# Patient Record
Sex: Female | Born: 1935 | Race: White | Hispanic: No | Smoking: Never smoker
Health system: Southern US, Community
[De-identification: ages and names within clinical notes are randomized; demographics above are authoritative.]

## PROBLEM LIST (undated history)

## (undated) DIAGNOSIS — C55 Malignant neoplasm of uterus, part unspecified: Secondary | ICD-10-CM

## (undated) DIAGNOSIS — K219 Gastro-esophageal reflux disease without esophagitis: Secondary | ICD-10-CM

## (undated) DIAGNOSIS — G8220 Paraplegia, unspecified: Secondary | ICD-10-CM

## (undated) DIAGNOSIS — R569 Unspecified convulsions: Secondary | ICD-10-CM

## (undated) DIAGNOSIS — G20A1 Parkinson's disease without dyskinesia, without mention of fluctuations: Secondary | ICD-10-CM

## (undated) DIAGNOSIS — I10 Essential (primary) hypertension: Secondary | ICD-10-CM

## (undated) DIAGNOSIS — G20 Parkinson's disease: Secondary | ICD-10-CM

## (undated) DIAGNOSIS — M1990 Unspecified osteoarthritis, unspecified site: Secondary | ICD-10-CM

## (undated) DIAGNOSIS — G9589 Other specified diseases of spinal cord: Secondary | ICD-10-CM

## (undated) HISTORY — PX: KNEE SURGERY: SHX244

## (undated) HISTORY — DX: Unspecified convulsions: R56.9

## (undated) HISTORY — PX: CHOLECYSTECTOMY: SHX55

## (undated) HISTORY — DX: Parkinson's disease without dyskinesia, without mention of fluctuations: G20.A1

## (undated) HISTORY — DX: Unspecified osteoarthritis, unspecified site: M19.90

## (undated) HISTORY — DX: Parkinson's disease: G20

## (undated) HISTORY — DX: Essential (primary) hypertension: I10

## (undated) HISTORY — PX: TONSILLECTOMY: SUR1361

## (undated) HISTORY — DX: Other specified diseases of spinal cord: G95.89

## (undated) HISTORY — DX: Malignant neoplasm of uterus, part unspecified: C55

## (undated) HISTORY — DX: Gastro-esophageal reflux disease without esophagitis: K21.9

## (undated) HISTORY — PX: ORIF HIP FRACTURE: SHX2125

---

## 2011-03-27 ENCOUNTER — Ambulatory Visit: Payer: Self-pay | Admitting: Oncology

## 2011-03-28 ENCOUNTER — Ambulatory Visit: Payer: Self-pay | Admitting: Oncology

## 2011-04-19 ENCOUNTER — Ambulatory Visit: Payer: Self-pay | Admitting: Oncology

## 2011-05-30 ENCOUNTER — Inpatient Hospital Stay: Payer: Self-pay | Admitting: Specialist

## 2011-09-25 LAB — CBC
HGB: 11.7 g/dL — ABNORMAL LOW (ref 12.0–16.0)
MCHC: 32.8 g/dL (ref 32.0–36.0)
WBC: 16.8 10*3/uL — ABNORMAL HIGH (ref 3.6–11.0)

## 2011-09-25 LAB — BASIC METABOLIC PANEL
EGFR (African American): >60
EGFR (Non-African Amer.): >60
Osmolality: 289 (ref 275–301)
Potassium: 4 mmol/L (ref 3.5–5.1)
Sodium: 142 mmol/L (ref 136–145)

## 2011-09-25 LAB — TROPONIN I: Troponin-I: <0.02 ng/mL

## 2011-09-26 LAB — CBC WITH DIFFERENTIAL/PLATELET
Basophil #: 0 x10 3/mm 3
Basophil %: 0.1 %
Eosinophil #: 0.2 x10 3/mm 3
Eosinophil %: 1.3 %
HCT: 32.8 % — ABNORMAL LOW
HGB: 10.8 g/dL — ABNORMAL LOW
Lymphocyte %: 76.8 %
Lymphs Abs: 9.3 x10 3/mm 3 — ABNORMAL HIGH
MCH: 29.9 pg
MCHC: 33 g/dL
MCV: 91 fL
Monocyte #: 0.5 "x10 3/mm "
Monocyte %: 4.1 %
Neutrophil #: 2.1 x10 3/mm 3
Neutrophil %: 17.7 %
Platelet: 207 x10 3/mm 3
RBC: 3.61 X10 6/mm 3 — ABNORMAL LOW
RDW: 16.5 % — ABNORMAL HIGH
WBC: 12.1 x10 3/mm 3 — ABNORMAL HIGH

## 2011-09-26 LAB — BASIC METABOLIC PANEL WITH GFR
Anion Gap: 7
BUN: 19 mg/dL — ABNORMAL HIGH
Calcium, Total: 7.9 mg/dL — ABNORMAL LOW
Chloride: 109 mmol/L — ABNORMAL HIGH
Co2: 28 mmol/L
Creatinine: 0.45 mg/dL — ABNORMAL LOW
EGFR (African American): >60
EGFR (Non-African Amer.): >60
Glucose: 78 mg/dL
Osmolality: 288
Potassium: 3.6 mmol/L
Sodium: 144 mmol/L

## 2011-09-26 LAB — LIPID PANEL
Cholesterol: 131 mg/dL (ref 0–200)
HDL Cholesterol: 49 mg/dL (ref 40–60)
Ldl Cholesterol, Calc: 67 mg/dL (ref 0–100)
Triglycerides: 74 mg/dL (ref 0–200)
VLDL Cholesterol, Calc: 15 mg/dL (ref 5–40)

## 2011-09-26 LAB — MAGNESIUM: Magnesium: 1.9 mg/dL

## 2011-09-26 LAB — CK TOTAL AND CKMB (NOT AT ARMC)
CK, Total: 35 U/L (ref 21–215)
CK, Total: 33 U/L (ref 21–215)
CK-MB: 0.5 ng/mL (ref 0.5–3.6)

## 2011-09-26 LAB — TROPONIN I
Troponin-I: <0.02 ng/mL
Troponin-I: <0.02 ng/mL

## 2011-09-27 ENCOUNTER — Inpatient Hospital Stay: Payer: Self-pay | Admitting: Internal Medicine

## 2012-04-16 ENCOUNTER — Ambulatory Visit: Payer: Self-pay | Admitting: Oncology

## 2012-04-18 ENCOUNTER — Ambulatory Visit: Payer: Self-pay | Admitting: Oncology

## 2012-04-23 ENCOUNTER — Ambulatory Visit: Payer: Self-pay | Admitting: Oncology

## 2012-04-23 LAB — COMPREHENSIVE METABOLIC PANEL
Alkaline Phosphatase: 102 U/L (ref 50–136)
Calcium, Total: 8.8 mg/dL (ref 8.5–10.1)
Chloride: 97 mmol/L — ABNORMAL LOW (ref 98–107)
Co2: 36 mmol/L — ABNORMAL HIGH (ref 21–32)
Creatinine: 0.71 mg/dL (ref 0.60–1.30)
EGFR (African American): >60
EGFR (Non-African Amer.): >60
Glucose: 85 mg/dL (ref 65–99)
SGOT(AST): 20 U/L (ref 15–37)
SGPT (ALT): 18 U/L (ref 12–78)
Sodium: 136 mmol/L (ref 136–145)
Total Protein: 6.2 g/dL — ABNORMAL LOW (ref 6.4–8.2)

## 2012-04-23 LAB — CBC CANCER CENTER
Basophil #: 0.2 x10 3/mm — ABNORMAL HIGH (ref 0.0–0.1)
Eosinophil #: 0.1 x10 3/mm (ref 0.0–0.7)
HCT: 41.6 % (ref 35.0–47.0)
Lymphocyte #: 25.4 x10 3/mm — ABNORMAL HIGH (ref 1.0–3.6)
Lymphocyte %: 86.4 %
MCHC: 32.5 g/dL (ref 32.0–36.0)
Monocyte %: 0.5 %
Neutrophil %: 12.1 %
RBC: 4.41 10*6/uL (ref 3.80–5.20)
RDW: 14.3 % (ref 11.5–14.5)
WBC: 29.4 x10 3/mm — ABNORMAL HIGH (ref 3.6–11.0)

## 2012-05-18 ENCOUNTER — Ambulatory Visit: Payer: Self-pay | Admitting: Oncology

## 2012-07-21 ENCOUNTER — Ambulatory Visit: Payer: Self-pay | Admitting: Oncology

## 2012-07-22 LAB — COMPREHENSIVE METABOLIC PANEL
Albumin: 3.4 g/dL (ref 3.4–5.0)
Alkaline Phosphatase: 114 U/L (ref 50–136)
Anion Gap: 5 — ABNORMAL LOW (ref 7–16)
Bilirubin,Total: 0.3 mg/dL (ref 0.2–1.0)
Co2: 32 mmol/L (ref 21–32)
Creatinine: 0.64 mg/dL (ref 0.60–1.30)
EGFR (African American): >60
EGFR (Non-African Amer.): >60
Glucose: 90 mg/dL (ref 65–99)
Potassium: 4.3 mmol/L (ref 3.5–5.1)
SGOT(AST): 25 U/L (ref 15–37)
Sodium: 134 mmol/L — ABNORMAL LOW (ref 136–145)
Total Protein: 6.5 g/dL (ref 6.4–8.2)

## 2012-08-16 ENCOUNTER — Ambulatory Visit: Payer: Self-pay | Admitting: Oncology

## 2012-09-16 ENCOUNTER — Ambulatory Visit: Payer: Self-pay | Admitting: Oncology

## 2012-09-24 LAB — CBC CANCER CENTER
Basophil #: 0.3 x10 3/mm — ABNORMAL HIGH (ref 0.0–0.1)
Basophil %: 0.8 %
Eosinophil %: 0.3 %
HCT: 42.6 % (ref 35.0–47.0)
HGB: 13.7 g/dL (ref 12.0–16.0)
Lymphocyte #: 29.8 x10 3/mm — ABNORMAL HIGH (ref 1.0–3.6)
Lymphocyte %: 87 %
Monocyte #: 0.3 x10 3/mm (ref 0.2–0.9)
Monocyte %: 0.9 %
Neutrophil #: 3.7 x10 3/mm (ref 1.4–6.5)
Platelet: 268 x10 3/mm (ref 150–440)
RBC: 4.5 10*6/uL (ref 3.80–5.20)
RDW: 14 % (ref 11.5–14.5)

## 2012-10-16 ENCOUNTER — Ambulatory Visit: Payer: Self-pay | Admitting: Oncology

## 2013-04-01 ENCOUNTER — Ambulatory Visit: Payer: Self-pay | Admitting: Oncology

## 2013-04-01 LAB — CBC CANCER CENTER
HGB: 13.8 g/dL (ref 12.0–16.0)
Lymphocyte #: 30.9 x10 3/mm — ABNORMAL HIGH (ref 1.0–3.6)
Lymphocyte %: 89.2 %
MCHC: 31.9 g/dL — ABNORMAL LOW (ref 32.0–36.0)
Monocyte #: 0.1 x10 3/mm — ABNORMAL LOW (ref 0.2–0.9)
Monocyte %: 0.3 %
Neutrophil #: 3.3 x10 3/mm (ref 1.4–6.5)
Platelet: 226 x10 3/mm (ref 150–440)
RBC: 4.43 10*6/uL (ref 3.80–5.20)
RDW: 14.5 % (ref 11.5–14.5)
WBC: 34.7 x10 3/mm — ABNORMAL HIGH (ref 3.6–11.0)

## 2013-04-18 ENCOUNTER — Ambulatory Visit: Payer: Self-pay | Admitting: Oncology

## 2013-10-14 ENCOUNTER — Ambulatory Visit: Payer: Self-pay | Admitting: Oncology

## 2013-10-14 LAB — CBC CANCER CENTER
Basophil #: 0.1 x10 3/mm (ref 0.0–0.1)
Basophil %: 0.3 %
Eosinophil #: 0.2 x10 3/mm (ref 0.0–0.7)
Eosinophil %: 0.7 %
HCT: 41.6 % (ref 35.0–47.0)
HGB: 13.5 g/dL (ref 12.0–16.0)
LYMPHS PCT: 87.3 %
Lymphocyte #: 30.1 x10 3/mm — ABNORMAL HIGH (ref 1.0–3.6)
MCH: 30.8 pg (ref 26.0–34.0)
MCHC: 32.5 g/dL (ref 32.0–36.0)
MCV: 95 fL (ref 80–100)
MONO ABS: 0.1 x10 3/mm — AB (ref 0.2–0.9)
Monocyte %: 0.4 %
NEUTROS ABS: 3.9 x10 3/mm (ref 1.4–6.5)
NEUTROS PCT: 11.3 %
Platelet: 233 x10 3/mm (ref 150–440)
RBC: 4.39 10*6/uL (ref 3.80–5.20)
RDW: 14.2 % (ref 11.5–14.5)
WBC: 34.5 x10 3/mm — AB (ref 3.6–11.0)

## 2013-10-16 ENCOUNTER — Ambulatory Visit: Payer: Self-pay | Admitting: Oncology

## 2014-03-18 ENCOUNTER — Ambulatory Visit: Payer: Self-pay | Admitting: Oncology

## 2014-04-14 LAB — CBC CANCER CENTER
Basophil #: 0.2 x10 3/mm — ABNORMAL HIGH (ref 0.0–0.1)
Basophil %: 0.4 %
EOS PCT: 0.6 %
Eosinophil #: 0.3 x10 3/mm (ref 0.0–0.7)
HCT: 40.5 % (ref 35.0–47.0)
HGB: 13.2 g/dL (ref 12.0–16.0)
Lymphocyte #: 40.2 x10 3/mm — ABNORMAL HIGH (ref 1.0–3.6)
Lymphocyte %: 89.7 %
MCH: 31.7 pg (ref 26.0–34.0)
MCHC: 32.6 g/dL (ref 32.0–36.0)
MCV: 97 fL (ref 80–100)
MONO ABS: 0.2 x10 3/mm (ref 0.2–0.9)
Monocyte %: 0.6 %
NEUTROS PCT: 8.7 %
Neutrophil #: 3.9 x10 3/mm (ref 1.4–6.5)
Platelet: 208 x10 3/mm (ref 150–440)
RBC: 4.16 10*6/uL (ref 3.80–5.20)
RDW: 14.1 % (ref 11.5–14.5)
WBC: 44.7 x10 3/mm — ABNORMAL HIGH (ref 3.6–11.0)

## 2014-04-18 ENCOUNTER — Ambulatory Visit: Payer: Self-pay | Admitting: Oncology

## 2014-10-10 NOTE — Consult Note (Signed)
PATIENT NAME:  Rachael Pham, Rachael Pham MR#:  [id number] DATE OF BIRTH:  March 18, 1927  DATE OF CONSULTATION:  09/26/2011  REFERRING PHYSICIAN:   CONSULTING PHYSICIAN:  Corey Skains, MD  PRIMARY CARE PHYSICIAN: Dr. Bridget Hartshorn   REASON FOR CONSULTATION: Unstable angina.   CHIEF COMPLAINT: I had nausea and chest pain.  HISTORY OF PRESENT ILLNESS: This is an 81 year old female with hypertension previously under reasonable control. There is no evidence of previous hyperlipidemia requiring further intervention. The patient has been having a hip fracture for which she is rehabilitating and with rehabilitation she has done relatively well but has had significant episodes of shortness of breath with abdominal discomfort, nausea, and vomiting. There was some minimal chest pain with that. The patient does have a normal EKG and normal troponin. She underwent a Lexiscan infusion showing a small fixed and reversible inferior myocardial perfusion defect consistent with previous infarct and/or possible myocardial ischemia. The patient has had no further episodes of chest pain although she is not ambulatory.   Remainder of review of systems is negative for vision change, ringing in the ears, hearing loss, cough, congestion, heartburn, nausea, vomiting, diarrhea, bloody stools, stomach pain, extremity pain, leg weakness, cramping of the buttocks, known blood clots, headaches, blackouts, dizzy spells, nosebleeds, congestion, trouble swallowing, frequent urination, urination at night, muscle weakness, numbness, anxiety, depression, skin lesions, skin rashes.   PAST MEDICAL HISTORY: Hypertension.   FAMILY HISTORY: No family members with early onset of cardiovascular disease.   SOCIAL HISTORY: The patient has remote tobacco use. Currently denies alcohol or tobacco use.   ALLERGIES: No known drug allergies.   CURRENT MEDICATIONS: As listed.   PHYSICAL EXAMINATION:   VITAL SIGNS: Blood pressure 136/68 bilaterally, heart  rate 72 upright, reclining, and regular.   GENERAL: She is a well appearing female in no acute distress.   HEENT: No icterus, thyromegaly, ulcers, hemorrhage, or xanthelasma.   CARDIOVASCULAR: Regular rate and rhythm. Normal S1 and S2 without murmur, gallop, or rub. PMI is normal size and placement. Carotid upstroke normal without bruit. Jugular venous pressure normal.   LUNGS: Lungs have few basilar crackles with normal respirations.   ABDOMEN: Soft, nontender without hepatosplenomegaly or masses. Abdominal aorta is normal size without bruit.   EXTREMITIES: 2+ bilateral pulses in dorsal, pedal, radial, and femoral arteries without lower extremity edema, cyanosis, clubbing, or ulcers.   NEUROLOGIC: She is oriented to time, place, and person with normal mood and affect.   ASSESSMENT: This is an 81 year old female with hypertension, nausea, vomiting, chest pain, shortness of breath, normal EKG, no evidence of myocardial infarction with abnormal stress test consistent with inferior myocardial ischemia.   RECOMMENDATIONS: Would recommend cardiac catheterization to assess coronary anatomy and further treatment thereof as necessary. The patient understands the risks and benefits of cardiac catheterization. These include the possibility of death, stroke, heart attack, infection, bleeding, or blood clot. She is at low risk for conscious sedation. The patient does not wish to pursue this at this time and would use medication management for hypertension control including aspirin for further risk reduction. We would recommend ambulation to follow for any further evidence of significant chest pain requiring further intervention including cardiac catheterization.  ____________________________ Corey Skains, MD bjk:drc D: 09/26/2011 17:50:03 ET T: 09/27/2011 10:09:46 ET JOB#: [id number] Corey Skains MD ELECTRONICALLY SIGNED 09/27/2011 13:18

## 2014-10-10 NOTE — H&P (Signed)
PATIENT NAME:  Rachael Pham, Rachael Pham MR#:  [id number] DATE OF BIRTH:  1927/05/26  DATE OF ADMISSION:  09/25/2011  REFERRING PHYSICIAN: ER physician, Dr. Rockey Situ PRIMARY CARE PHYSICIAN: Dr. Bridget Hartshorn   CHIEF COMPLAINT: Chest pain.  HISTORY OF PRESENT ILLNESS: Patient is an 81 year old Caucasian female with past medical history of hypertension, chronic leukocytosis who was last admitted to [facility] 12/12 for her left hip fracture status post open reduction internal fixation. She presents today to the ED with complaints of chest pain. Patient reports that she woke up with chest pain yesterday and subsequently had several episodes of nausea and vomiting. Patient thought that it was possibly related to indigestion, however, she got concerned and came to the ED today because she woke up with retrosternal pain described as somebody pressing on her chest, today also. She denies any nausea, vomiting today. Denies any shortness of breath, diaphoresis or radiation. She is not very ambulatory and usually walks with a walker. She denies any prior history of any coronary artery disease or heart problems.   PAST MEDICAL HISTORY:  1. Uterine cancer status post radiation and hysterectomy.  2. Hypertension.  3. 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  4. 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 Patient was seen by Dr. Grayland Ormond in October 2012.  5. Questionable history of seizures. Patient denies any history of seizure and is not on any anticonvulsant medications.  6. Last admission to [facility] 12/12 for left hip fracture status post open reduction internal fixation.   PAST SURGICAL HISTORY:  1. Left hip open reduction internal fixation. 2. Tonsillectomy.  3. Cholecystectomy. 4. Knee surgery.  5. C-section. 6. Hysterectomy.   ALLERGIES: Penicillin and sulfa.   MEDICATIONS:  1. Atenolol 50 mg daily.  2. Calcium with vitamin D 1 tablet daily.  3. Fosamax 70 mg q. weekly.  4. Neurontin 600 mg at bedtime. 5. Metamucil  p.r.n.  6. MiraLax p.r.n.  7. Senna p.r.n.  8. Norco 5/325, 1 tablet every six hours p.r.n.  9. Tylenol p.r.n.   FAMILY HISTORY: Positive for coronary artery disease and cancer.   SOCIAL HISTORY: Patient lives at [facility] in [place]. Prior to that she was living with her son. Denies any alcohol or drug abuse. She quit smoking more than 30-40 years ago, used to smoke half pack per Janelle.    REVIEW OF SYSTEMS: CONSTITUTIONAL: Denies any fever, fatigue, weakness. EYES: Denies any blurred or double vision. ENT: Denies tinnitus, ear pain. RESPIRATORY: Denies any cough, wheezing. CARDIOVASCULAR: Denies any palpitations or syncope. Reports chest pain. GASTROINTESTINAL: Denies any nausea, vomiting, diarrhea, abdominal pain today. GENITOURINARY: Denies any dysuria, hematuria. ENDO: Denies any polyuria, nocturia. HEME/LYMPH: Denies any anemia, easy bruisability. INTEGUMENT: Denies any acne, rash. MUSCULOSKELETAL: Denies any swelling, gout. NEUROLOGICAL: Denies any numbness, weakness. PSYCH: Denies any anxiety, depression.   PHYSICAL EXAMINATION:  VITAL SIGNS: Temperature 96.6, heart rate 71, respiratory rate 18, blood pressure 126/59, pulse ox 96% on room air.   GENERAL: Patient is an elderly Caucasian female lying comfortably in bed, not in acute distress.   HEAD: Atraumatic, normocephalic.   EYES: There is some pallor. No icterus or cyanosis. Pupils are equal, round, and reactive to light and accommodation. Extraocular movements intact.   ENT: Wet mucous membranes. No oropharyngeal erythema or thrush.   NECK: Supple. No masses. No JVD. No thyromegaly.   CHEST WALL: No tenderness to palpation. Not using accessory muscles of respiration. No intercostal muscle retractions.   LUNGS: Bilaterally clear to auscultation. No wheezing, rales, rhonchi.  CARDIOVASCULAR: S1, S2 regular. No murmur, rubs, or gallops.   ABDOMEN: Soft, nontender, nondistended. No guarding. No rigidity. No  organomegaly. Normal bowel sounds.   SKIN: No rashes or lesions.   PERIPHERIES: Patient has 1+ pedal edema and 1+ pedal pulses.   MUSCULOSKELETAL: No cyanosis or clubbing.   NEUROLOGIC: Awake, alert, oriented x3. Nonfocal neurological exam. Cranial nerves grossly intact.   PSYCH: Normal mood and affect.   LABORATORY, DIAGNOSTIC, AND RADIOLOGICAL DATA: Chest x-ray shows no acute abnormality. White count 16.8, hemoglobin 11.7, hematocrit 35.6, platelets 226, glucose 87, BUN 30, creatinine 0.52, sodium 142, rest of BMP is normal. Cardiac enzymes negative. EKG shows no evidence of any ischemia.   ASSESSMENT AND PLAN: 81 year old female with past medical history of hypertension presents and recurrent chest pain. 1. Chest pain. Will admit the patient to the hospital and monitor on telemetry. Check serial cardiac enzymes. Start on aspirin. Continue her beta blocker. Start on ACE inhibitor, statin, p.r.n. nitro paste. Will check a fasting lipid profile. Will obtain an inpatient stress test. 2. Leukocytosis. Appears to be chronic. Patient has been evaluated for CLL by Dr. Grayland Ormond in the past.  3. Anemia, possibly of chronic disease. Will monitor closely.  4. Mild dehydration, elevated BUN. Will give patient gentle IV fluid hydration and recheck in a.m.  5. Hypertension. Appears to be stable at present. Will continue current medications.   Reviewed all medical records, discussed with ED physician, discussed with the patient and her son the plan of care and management.   TIME SPENT: 75 minutes.   ____________________________ Cherre Huger, MD sp:cms D: 09/25/2011 18:06:41 ET T: 09/26/2011 05:49:19 ET JOB#: [id number]  cc: Cherre Huger, MD, <Dictator> Manual Meier. Bridget Hartshorn, MD Cherre Huger MD ELECTRONICALLY SIGNED 09/28/2011 17:49

## 2014-10-10 NOTE — Discharge Summary (Signed)
PATIENT NAME:  Rachael Pham, Rachael Pham MR#:  [id number] DATE OF BIRTH:  06/01/1927  DATE OF ADMISSION:  09/27/2011 DATE OF DISCHARGE:  09/28/2011   DISCHARGE DIAGNOSES:  1. Chest pain with negative serial cardiac enzymes, abnormal Myoview requiring cardiac cath which was negative and showed normal LV function, normal coronary arteries, possible etiology could be stomach indigestion, now improved.  2. Dehydration, resolved with IV fluids, possibly due to recent nausea and vomiting.   SECONDARY DIAGNOSES:  1. Hypertension.  2. 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  3. Leukocytosis. 4. History of seizure.   CONSULTATION: Cardiology, Dr. Nehemiah Massed   PROCEDURES/RADIOLOGY:  1. Cardiac cath on April 11th showed normal coronary arteries, normal LV function.  2. Myoview on April 10th showed mild fixed and reversible inferior myocardial perfusion defect consistent with previous infarct scar and/or minimal reversibility consistent with myocardial ischemia. Ejection fraction of 36%. Mild segmental LV systolic dysfunction.  3. Chest x-ray on April 9th showed no acute cardiopulmonary disease.   HISTORY AND SHORT HOSPITAL COURSE: The patient is an 81 year old female with the above-mentioned medical problems who was admitted for chest pain, was ruled out with three negative sets of cardiac enzymes but had abnormal Myoview with some reversible ischemia for which Cardiology consult was obtained with Dr. Nehemiah Massed who recommended cardiac cath which was obtained and was negative. She is feeling much better. Her chest pain was thought to be due to stomach indigestion as she had some nausea and vomiting and has been resolved now. Her dehydration was resolved with IV fluids. She is doing much better and is being discharged back to her facility in stable condition.   On the date of discharge her vital signs are as follows: Temperature 98.2, heart rate 55 per minute, respirations 18 per minute, blood pressure  101/54 mmHg. She is saturating 96% on room air.   PERTINENT PHYSICAL EXAMINATION: CARDIOVASCULAR: S1, S2 normal. No murmurs, rubs, or gallops. LUNGS: Clear to auscultation bilaterally. No wheezes, rales, rhonchi, or crepitation. ABDOMEN: Soft, benign. NEUROLOGICAL: Nonfocal examination. All other physical examination remained at baseline.   DISCHARGE MEDICATIONS:  1. Norco 5/325 1 tablet p.o. every six hours as needed.  2. MiraLAX once daily as needed.  3. Metamucil 5 mL p.o. daily as needed.  4. Senna 8.6 mg 2 tablets p.o. daily as needed.  5. Fosamax 70 mg once a week.  6. Gabapentin 600 mg p.o. at bedtime.  7. Calcium with Vitamin D 1 tablet p.o. daily.  8. Tylenol 650 mg p.o. every four hours as needed.  9. Atenolol 25 mg p.o. daily.  DISCHARGE DIET: Low sodium.    DISCHARGE ACTIVITY: As tolerated.   DISCHARGE INSTRUCTIONS AND FOLLOW-UP:  1. The patient was instructed to follow up with her primary care physician, Dr. Marval Regal, in 1 to 2 weeks.  2. She will need follow-up with Dr. Nehemiah Massed in 2 to 3 weeks.   TOTAL TIME DISCHARGING THIS PATIENT: 45 minutes.   ____________________________ Lucina Mellow. Manuella Ghazi, MD vss:drc D: 09/28/2011 08:33:41 ET T: 09/28/2011 08:57:13 ET JOB#: [id number]  cc: Alim Cattell S. Manuella Ghazi, MD, <Dictator>, Manual Meier. Bridget Hartshorn, MD, Corey Skains, MD Lucina Mellow [facility] MD ELECTRONICALLY SIGNED 09/28/2011 16:38

## 2014-10-15 ENCOUNTER — Ambulatory Visit
Admit: 2014-10-15 | Disposition: A | Discharge status: Home / Self Care | Payer: Self-pay | Attending: Oncology | Admitting: Oncology

## 2014-10-15 LAB — CBC CANCER CENTER
BASOS ABS: 0.2 x10 3/mm — AB (ref 0.0–0.1)
Basophil %: 0.4 %
EOS ABS: 0.1 x10 3/mm (ref 0.0–0.7)
Eosinophil %: 0.3 %
HCT: 40 % (ref 35.0–47.0)
HGB: 13.1 g/dL (ref 12.0–16.0)
LYMPHS ABS: 45.8 x10 3/mm — AB (ref 1.0–3.6)
Lymphocyte %: 90.7 %
MCH: 31.6 pg (ref 26.0–34.0)
MCHC: 32.8 g/dL (ref 32.0–36.0)
MCV: 97 fL (ref 80–100)
MONO ABS: 0.2 x10 3/mm (ref 0.2–0.9)
MONOS PCT: 0.4 %
Neutrophil #: 4.1 x10 3/mm (ref 1.4–6.5)
Neutrophil %: 8.2 %
PLATELETS: 194 x10 3/mm (ref 150–440)
RBC: 4.15 10*6/uL (ref 3.80–5.20)
RDW: 13.8 % (ref 11.5–14.5)
WBC: 50.4 x10 3/mm — AB (ref 3.6–11.0)

## 2015-04-15 ENCOUNTER — Other Ambulatory Visit: Payer: Self-pay

## 2015-04-15 ENCOUNTER — Ambulatory Visit: Payer: Self-pay | Admitting: Oncology

## 2015-04-29 ENCOUNTER — Inpatient Hospital Stay (HOSPITAL_BASED_OUTPATIENT_CLINIC_OR_DEPARTMENT_OTHER): Payer: Medicare Other | Admitting: Oncology

## 2015-04-29 ENCOUNTER — Other Ambulatory Visit: Payer: Self-pay | Admitting: *Deleted

## 2015-04-29 ENCOUNTER — Inpatient Hospital Stay: Payer: Medicare Other | Attending: Oncology

## 2015-04-29 ENCOUNTER — Encounter: Payer: Self-pay | Admitting: Oncology

## 2015-04-29 VITALS — BP 129/56 | HR 76 | Temp 97.2°F | Resp 76

## 2015-04-29 DIAGNOSIS — G20 Parkinson's disease: Secondary | ICD-10-CM | POA: Insufficient documentation

## 2015-04-29 DIAGNOSIS — K219 Gastro-esophageal reflux disease without esophagitis: Secondary | ICD-10-CM | POA: Insufficient documentation

## 2015-04-29 DIAGNOSIS — Z79899 Other long term (current) drug therapy: Secondary | ICD-10-CM | POA: Insufficient documentation

## 2015-04-29 DIAGNOSIS — C9110 Chronic lymphocytic leukemia of B-cell type not having achieved remission: Secondary | ICD-10-CM

## 2015-04-29 DIAGNOSIS — I10 Essential (primary) hypertension: Secondary | ICD-10-CM | POA: Insufficient documentation

## 2015-04-29 LAB — CBC WITH DIFFERENTIAL/PLATELET
Basophils Absolute: 0.1 10*3/uL (ref 0–0.1)
Eosinophils Absolute: 0.1 10*3/uL (ref 0–0.7)
Eosinophils Relative: 0 %
HEMATOCRIT: 40.7 % (ref 35.0–47.0)
HEMOGLOBIN: 13.2 g/dL (ref 12.0–16.0)
Lymphocytes Relative: 93 %
Lymphs Abs: 48.6 10*3/uL — ABNORMAL HIGH (ref 1.0–3.6)
MCH: 31.7 pg (ref 26.0–34.0)
MCHC: 32.4 g/dL (ref 32.0–36.0)
MCV: 97.7 fL (ref 80.0–100.0)
Monocytes Absolute: 0.3 10*3/uL (ref 0.2–0.9)
NEUTROS ABS: 3.2 10*3/uL (ref 1.4–6.5)
Platelets: 208 10*3/uL (ref 150–440)
RBC: 4.16 MIL/uL (ref 3.80–5.20)
RDW: 14.5 % (ref 11.5–14.5)
WBC: 52.3 10*3/uL (ref 3.6–11.0)

## 2015-04-29 NOTE — Progress Notes (Signed)
Patient offers no concerns or problems today.

## 2015-04-29 NOTE — Progress Notes (Signed)
[place]  [phone] [phone]  ID: Otillia Javorsky Ceballos OB: [id number]  MR#: [id number]  [id number]  No care team member to display  CHIEF COMPLAINT:  Chief Complaint  Patient presents with  . CLL    INTERVAL HISTORY: Patient returns to clinic today for repeat laboratory work and further evaluation.  She continues to feel well and remains asymptomatic.  She denies any recent fevers or illnesses.  She has no night sweats.  She has a good appetite and denies weight loss.  She has no neurologic complaints.  She denies any pain.  She has no neurologic complaints.  She denies any chest pain, shortness of breath, or cough.  She has no nausea, vomiting, constipation, or diarrhea.  She has no urinary complaints.  Patient offers no specific complaints today.  REVIEW OF SYSTEMS:   Review of Systems  Constitutional: Negative for fever, weight loss and malaise/fatigue.  Respiratory: Negative.  Negative for shortness of breath.   Cardiovascular: Negative.   Gastrointestinal: Negative.   Musculoskeletal: Negative.   Neurological: Negative.  Negative for weakness.    As per HPI. Otherwise, a complete review of systems is negatve.  PAST MEDICAL HISTORY: Past Medical History  Diagnosis Date  . Hypertension   . Parkinson disease ([place])   . GERD (gastroesophageal reflux disease)   . Osteoarthritis   . 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 Unspecified convulsions ([place])     PAST SURGICAL HISTORY: Past Surgical History  Procedure Laterality Date  . Cholecystectomy      FAMILY HISTORY No family history on file.     ADVANCED DIRECTIVES:    HEALTH MAINTENANCE: Social History  Substance Use Topics  . Smoking status: Not on file  . Smokeless tobacco: Not on file  . Alcohol Use: Not on file     Colonoscopy:  PAP:  Bone density:  Lipid panel:  Allergies  Allergen Reactions  . Penicillins   . Sulfa Antibiotics     Current  Outpatient Prescriptions  Medication Sig Dispense Refill  . acetaminophen (TYLENOL) 325 MG tablet Take 650 mg by mouth every 4 (four) hours as needed.    Marland Kitchen alendronate (FOSAMAX) 70 MG tablet Take 70 mg by mouth once a week. Take with a full glass of water on an empty stomach.    . calcium-vitamin D (OSCAL WITH D) 500-200 MG-UNIT tablet Take 1 tablet by mouth.    . gabapentin (NEURONTIN) 300 MG capsule Take 300 mg by mouth 3 (three) times daily.    Marland Kitchen gabapentin (NEURONTIN) 600 MG tablet Take 600 mg by mouth at bedtime.    . magnesium hydroxide (MILK OF MAGNESIA) 400 MG/5ML suspension Take 30 mLs by mouth every other [place].    Marland Kitchen omeprazole (PRILOSEC) 20 MG capsule Take 20 mg by mouth daily.    Vladimir Faster Glycol-Propyl Glycol (SYSTANE) 0.4-0.3 % GEL ophthalmic gel Place 1 application into both eyes.    Marland Kitchen psyllium (METAMUCIL) 58.6 % powder Take 1 packet by mouth daily.    Marland Kitchen senna (SENOKOT) 8.6 MG tablet Take 2 tablets by mouth daily.     No current facility-administered medications for this visit.    OBJECTIVE: Filed Vitals:   04/29/15 0952  BP: 129/56  Pulse: 76  Temp: 97.2 F (36.2 C)  Resp: 76     There is no height or weight on file to calculate BMI.    ECOG FS:2 - Symptomatic, <50% confined to bed  General: Well-developed,  well-nourished, no acute distress. Eyes: Pink conjunctiva, anicteric sclera. Lungs: Clear to auscultation bilaterally. Heart: Regular rate and rhythm. No rubs, murmurs, or gallops. Abdomen: Soft, nontender, nondistended. No organomegaly noted, normoactive bowel sounds. Musculoskeletal: No edema, cyanosis, or clubbing. Neuro: Alert, answering all questions appropriately. Cranial nerves grossly intact. Skin: No rashes or petechiae noted. Psych: Normal affect.   LAB RESULTS:  Lab Results  Component Value Date   NA 134* 07/22/2012   K 4.3 07/22/2012   CL 97* 07/22/2012   CO2 32 07/22/2012   GLUCOSE 90 07/22/2012   BUN 16 07/22/2012   CREATININE 0.64  07/22/2012   CALCIUM 8.7 07/22/2012   PROT 6.5 07/22/2012   ALBUMIN 3.4 07/22/2012   AST 25 07/22/2012   ALT 19 07/22/2012   ALKPHOS 114 07/22/2012   BILITOT 0.3 07/22/2012   GFRNONAA >60 07/22/2012   GFRAA >60 07/22/2012    Lab Results  Component Value Date   WBC 52.3* 04/29/2015   NEUTROABS 3.2 04/29/2015   HGB 13.2 04/29/2015   HCT 40.7 04/29/2015   MCV 97.7 04/29/2015   PLT 208 04/29/2015     STUDIES: No results found.  ASSESSMENT:   PLAN:    1.  CLL: Flow cytometry confirmed the results.  Patient's white blood cell count continues to slowly trend up, but it has been 3 years and she has not had a doubling time.  Her white blood cell count in November 2013 was 29.4.  Patient is a Rai stage 0, therefore no intervention needed at this time.  Previously, CT scan of the neck, chest, abdomen, and pelvis did not reveal lymphadenopathy.  This does not need to be repeated unless there is suspicion of progression of disease. Return to clinic in 6 months with repeat laboratory work and further evaluation.    Patient expressed understanding and was in agreement with this plan. She also understands that She can call clinic at any time with any questions, concerns, or complaints.    Lloyd Huger, MD   04/29/2015 10:00 AM

## 2015-10-28 ENCOUNTER — Inpatient Hospital Stay: Payer: Medicare Other | Attending: Oncology

## 2015-10-28 ENCOUNTER — Inpatient Hospital Stay (HOSPITAL_BASED_OUTPATIENT_CLINIC_OR_DEPARTMENT_OTHER): Payer: Medicare Other | Admitting: Family Medicine

## 2015-10-28 VITALS — BP 111/41 | HR 80 | Temp 98.2°F | Resp 14

## 2015-10-28 DIAGNOSIS — Z8542 Personal history of malignant neoplasm of other parts of uterus: Secondary | ICD-10-CM | POA: Diagnosis not present

## 2015-10-28 DIAGNOSIS — K219 Gastro-esophageal reflux disease without esophagitis: Secondary | ICD-10-CM | POA: Insufficient documentation

## 2015-10-28 DIAGNOSIS — G20 Parkinson's disease: Secondary | ICD-10-CM | POA: Diagnosis not present

## 2015-10-28 DIAGNOSIS — Z79899 Other long term (current) drug therapy: Secondary | ICD-10-CM | POA: Diagnosis not present

## 2015-10-28 DIAGNOSIS — C9110 Chronic lymphocytic leukemia of B-cell type not having achieved remission: Secondary | ICD-10-CM

## 2015-10-28 DIAGNOSIS — M1990 Unspecified osteoarthritis, unspecified site: Secondary | ICD-10-CM | POA: Insufficient documentation

## 2015-10-28 DIAGNOSIS — I10 Essential (primary) hypertension: Secondary | ICD-10-CM | POA: Diagnosis not present

## 2015-10-28 LAB — CBC WITH DIFFERENTIAL/PLATELET
Basophils Absolute: 0.1 10*3/uL (ref 0–0.1)
Basophils Relative: 0 %
Eosinophils Absolute: 0.2 10*3/uL (ref 0–0.7)
Eosinophils Relative: 1 %
HEMATOCRIT: 40.6 % (ref 35.0–47.0)
HEMOGLOBIN: 13.2 g/dL (ref 12.0–16.0)
LYMPHS PCT: 89 %
Lymphs Abs: 32.3 10*3/uL — ABNORMAL HIGH (ref 1.0–3.6)
MCH: 30.4 pg (ref 26.0–34.0)
MCHC: 32.6 g/dL (ref 32.0–36.0)
MCV: 93.3 fL (ref 80.0–100.0)
MONO ABS: 0.2 10*3/uL (ref 0.2–0.9)
MONOS PCT: 1 %
NEUTROS ABS: 3.1 10*3/uL (ref 1.4–6.5)
NEUTROS PCT: 9 %
Platelets: 201 10*3/uL (ref 150–440)
RBC: 4.35 MIL/uL (ref 3.80–5.20)
RDW: 14.2 % (ref 11.5–14.5)
WBC: 36 10*3/uL — ABNORMAL HIGH (ref 3.6–11.0)

## 2015-10-28 NOTE — Progress Notes (Signed)
[place]  [phone] [phone]  ID: Rachael Pham OB: [id number]  MR#: [id number]  [id number]  No care team member to display  CHIEF COMPLAINT:  Chief Complaint  Patient presents with  . CLL    INTERVAL HISTORY: Patient returns to clinic today for repeat laboratory work and further evaluation.  She continues to feel well and remains asymptomatic.  She denies any recent fevers or illnesses.  She has no night sweats.  She has a good appetite and denies weight loss.  She has no neurologic complaints.  She denies any pain.  She has no neurologic complaints.  She denies any chest pain, shortness of breath, or cough.  She has no nausea, vomiting, constipation, or diarrhea.  She has no urinary complaints. Patient has been going to dermatology regarding areas on her face. Patient offers no specific complaints today.  REVIEW OF SYSTEMS:   Review of Systems  Constitutional: Positive for malaise/fatigue. Negative for fever, chills, weight loss and diaphoresis.       Lives in skilled nursing  HENT: Negative.   Eyes: Negative.   Respiratory: Negative.  Negative for cough, hemoptysis, sputum production, shortness of breath and wheezing.   Cardiovascular: Negative.  Negative for chest pain, palpitations, orthopnea, claudication, leg swelling and PND.  Gastrointestinal: Negative.  Negative for heartburn, nausea, vomiting, abdominal pain, diarrhea, constipation, blood in stool and melena.  Genitourinary: Negative.   Musculoskeletal: Negative.   Skin:       Dermatology following issues on right cheek and right nose  Neurological: Positive for weakness. Negative for dizziness, tingling, focal weakness and seizures.  Endo/Heme/Allergies: Does not bruise/bleed easily.  Psychiatric/Behavioral: Negative for depression. The patient is not nervous/anxious and does not have insomnia.     As per HPI. Otherwise, a complete review of systems is negatve.  PAST  MEDICAL HISTORY: Past Medical History  Diagnosis Date  . Hypertension   . Parkinson disease ([place])   . GERD (gastroesophageal reflux disease)   . Osteoarthritis   . 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 Uterine cancer ([place])     PAST SURGICAL HISTORY: Past Surgical History  Procedure Laterality Date  . Cholecystectomy    . Tonsillectomy    . Knee surgery    . Orif hip fracture      FAMILY HISTORY No family history on file.     ADVANCED DIRECTIVES:    HEALTH MAINTENANCE: Social History  Substance Use Topics  . Smoking status: Never Smoker   . Smokeless tobacco: Never Used  . Alcohol Use: No     Allergies  Allergen Reactions  . Penicillins   . Sulfa Antibiotics     Current Outpatient Prescriptions  Medication Sig Dispense Refill  . acetaminophen (TYLENOL) 325 MG tablet Take 650 mg by mouth every 4 (four) hours as needed.    Marland Kitchen alendronate (FOSAMAX) 70 MG tablet Take 70 mg by mouth once a week. Take with a full glass of water on an empty stomach.    . calcium-vitamin D (OSCAL WITH D) 500-200 MG-UNIT tablet Take 1 tablet by mouth.    . gabapentin (NEURONTIN) 600 MG tablet Take 600 mg by mouth at bedtime.    . magnesium hydroxide (MILK OF MAGNESIA) 400 MG/5ML suspension Take 30 mLs by mouth every other [place].    Marland Kitchen omeprazole (PRILOSEC) 20 MG capsule Take 20 mg by mouth daily.    Vladimir Faster Glycol-Propyl Glycol (SYSTANE) 0.4-0.3 %  GEL ophthalmic gel Place 1 application into both eyes.    Marland Kitchen psyllium (METAMUCIL) 58.6 % powder Take 1 packet by mouth daily.    Marland Kitchen senna (SENOKOT) 8.6 MG tablet Take 2 tablets by mouth daily.    Marland Kitchen gabapentin (NEURONTIN) 300 MG capsule Take 300 mg by mouth 3 (three) times daily. Reported on 10/28/2015     No current facility-administered medications for this visit.    OBJECTIVE: Filed Vitals:   10/28/15 0949  BP: 111/41  Pulse: 80  Temp: 98.2 F (36.8 C)  Resp: 14     There is no height or  weight on file to calculate BMI.    ECOG FS:2 - Symptomatic, <50% confined to bed  General: Well-developed, well-nourished, no acute distress. Eyes: Pink conjunctiva, anicteric sclera. Lungs: Clear to auscultation bilaterally. Heart: Regular rate and rhythm. No rubs, murmurs, or gallops. Abdomen: Soft, nontender, nondistended. No organomegaly noted, normoactive bowel sounds. Musculoskeletal: No edema, cyanosis, or clubbing. Neuro: Alert, answering all questions appropriately. Cranial nerves grossly intact. Skin: No rashes or petechiae noted. Psych: Normal affect. No cervical, axillary or supraclavicular lymphadenopathy.   LAB RESULTS:  Lab Results  Component Value Date   NA 134* 07/22/2012   K 4.3 07/22/2012   CL 97* 07/22/2012   CO2 32 07/22/2012   GLUCOSE 90 07/22/2012   BUN 16 07/22/2012   CREATININE 0.64 07/22/2012   CALCIUM 8.7 07/22/2012   PROT 6.5 07/22/2012   ALBUMIN 3.4 07/22/2012   AST 25 07/22/2012   ALT 19 07/22/2012   ALKPHOS 114 07/22/2012   BILITOT 0.3 07/22/2012   GFRNONAA >60 07/22/2012   GFRAA >60 07/22/2012    Lab Results  Component Value Date   WBC 36.0* 10/28/2015   NEUTROABS 3.1 10/28/2015   HGB 13.2 10/28/2015   HCT 40.6 10/28/2015   MCV 93.3 10/28/2015   PLT 201 10/28/2015     STUDIES: No results found.  Assessment/PLAN:    1.  CLL: Flow cytometry confirmed the results.  Patient's white blood cell count has trended down, 36.0 today.  Her white blood cell count in November 2013 was 29.4.  Patient is a Rai stage 0, therefore no intervention needed at this time.  Previously, CT scan of the neck, chest, abdomen, and pelvis did not reveal lymphadenopathy.  This does not need to be repeated unless there is suspicion of progression of disease. Return to clinic in 6 months with repeat laboratory work and further evaluation.    Patient expressed understanding and was in agreement with this plan. She also understands that She can call clinic at any  time with any questions, concerns, or complaints.    Evlyn Kanner, NP   10/28/2015 10:03 AM

## 2015-10-28 NOTE — Progress Notes (Signed)
Patient does not offer any problems today.  

## 2016-05-02 ENCOUNTER — Other Ambulatory Visit: Payer: Self-pay | Admitting: *Deleted

## 2016-05-02 DIAGNOSIS — C9110 Chronic lymphocytic leukemia of B-cell type not having achieved remission: Secondary | ICD-10-CM

## 2016-05-03 DIAGNOSIS — C9110 Chronic lymphocytic leukemia of B-cell type not having achieved remission: Secondary | ICD-10-CM | POA: Insufficient documentation

## 2016-05-03 NOTE — Progress Notes (Signed)
[place]  Telephone:(336) [phone] [phone]) [phone]  ID: Rachael Pham OB: [id number]  MR#: [id number]  [id number]  No care team member to display  CHIEF COMPLAINT: CLL  INTERVAL HISTORY: Patient returns to clinic today for repeat laboratory work and further evaluation.  She continues to feel well and remains asymptomatic.  She denies any recent fevers or illnesses.  She has no night sweats.  She has a good appetite and denies weight loss.  She has no neurologic complaints.  She denies any pain.  She has no neurologic complaints.  She denies any chest pain, shortness of breath, or cough.  She has no nausea, vomiting, constipation, or diarrhea.  She has no urinary complaints. Patient has been going to dermatology regarding areas on her face. Patient offers no specific complaints today.  REVIEW OF SYSTEMS:   Review of Systems  Constitutional: Positive for malaise/fatigue. Negative for fever and weight loss.  Respiratory: Negative.  Negative for cough and shortness of breath.   Cardiovascular: Negative.  Negative for chest pain and leg swelling.  Gastrointestinal: Negative.  Negative for abdominal pain.  Genitourinary: Negative.   Musculoskeletal: Positive for back pain and joint pain.  Neurological: Positive for weakness. Negative for sensory change.  Psychiatric/Behavioral: Negative.  The patient is not nervous/anxious.     As per HPI. Otherwise, a complete review of systems is negative.  PAST MEDICAL HISTORY: Past Medical History:  Diagnosis Date  . GERD (gastroesophageal reflux disease)   . Hypertension   . Osteoarthritis   . 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 Parkinson disease ([place])   . 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 Uterine cancer ([place])     PAST SURGICAL HISTORY: Past Surgical History:  Procedure Laterality Date  . CHOLECYSTECTOMY    . KNEE SURGERY    . ORIF HIP FRACTURE    . TONSILLECTOMY      FAMILY HISTORY: Reviewed and  unchanged. No reported history of malignancy or chronic disease.     ADVANCED DIRECTIVES:    HEALTH MAINTENANCE: Social History  Substance Use Topics  . Smoking status: Never Smoker  . Smokeless tobacco: Never Used  . Alcohol use No     Allergies  Allergen Reactions  . Penicillins   . Sulfa Antibiotics     Current Outpatient Prescriptions  Medication Sig Dispense Refill  . acetaminophen (TYLENOL) 325 MG tablet Take 650 mg by mouth every 4 (four) hours as needed.    Marland Kitchen alendronate (FOSAMAX) 70 MG tablet Take 70 mg by mouth once a week. Take with a full glass of water on an empty stomach.    . calcium-vitamin D (OSCAL WITH D) 500-200 MG-UNIT tablet Take 1 tablet by mouth.    . gabapentin (NEURONTIN) 300 MG capsule Take 300 mg by mouth 3 (three) times daily. Reported on 10/28/2015    . gabapentin (NEURONTIN) 600 MG tablet Take 600 mg by mouth at bedtime.    . magnesium hydroxide (MILK OF MAGNESIA) 400 MG/5ML suspension Take 30 mLs by mouth every other [place].    Marland Kitchen omeprazole (PRILOSEC) 20 MG capsule Take 20 mg by mouth daily.    Vladimir Faster Glycol-Propyl Glycol (SYSTANE) 0.4-0.3 % GEL ophthalmic gel Place 1 application into both eyes.    Marland Kitchen psyllium (METAMUCIL) 58.6 % powder Take 1 packet by mouth daily.    Marland Kitchen senna (SENOKOT) 8.6 MG tablet Take 2 tablets by mouth daily.     No current facility-administered medications for this visit.  OBJECTIVE: Vitals:   05/04/16 1019  BP: 116/77  Pulse: 75  Temp: 98 F (36.7 C)     There is no height or weight on file to calculate BMI.    ECOG FS:2 - Symptomatic, <50% confined to bed  General: Well-developed, well-nourished, no acute distress. Sitting in a wheelchair Eyes: Pink conjunctiva, anicteric sclera. HEENT: 1 1/2 cm lesion on nose suspicious for squamous cell carcinoma. Lungs: Clear to auscultation bilaterally. Heart: Regular rate and rhythm. No rubs, murmurs, or gallops. Abdomen: Soft, nontender, nondistended. No organomegaly  noted, normoactive bowel sounds. Musculoskeletal: No edema, cyanosis, or clubbing. Neuro: Alert, answering all questions appropriately. Cranial nerves grossly intact. Skin: No rashes or petechiae noted. Psych: Normal affect. No cervical, axillary or supraclavicular lymphadenopathy.   LAB RESULTS:  Lab Results  Component Value Date   NA 134 (L) 07/22/2012   K 4.3 07/22/2012   CL 97 (L) 07/22/2012   CO2 32 07/22/2012   GLUCOSE 90 07/22/2012   BUN 16 07/22/2012   CREATININE 0.64 07/22/2012   CALCIUM 8.7 07/22/2012   PROT 6.5 07/22/2012   ALBUMIN 3.4 07/22/2012   AST 25 07/22/2012   ALT 19 07/22/2012   ALKPHOS 114 07/22/2012   BILITOT 0.3 07/22/2012   GFRNONAA >60 07/22/2012   GFRAA >60 07/22/2012    Lab Results  Component Value Date   WBC 42.4 (H) 05/04/2016   NEUTROABS 2.8 05/04/2016   HGB 12.7 05/04/2016   HCT 39.5 05/04/2016   MCV 94.0 05/04/2016   PLT 199 05/04/2016     STUDIES: No results found.  Assessment/PLAN:    1.  CLL: Flow cytometry confirmed the results. Patient's white blood cell count has ranged from 34-52 since April 2014. Patient is a Rai stage 0, therefore no intervention needed at this time.  Previously, CT scan of the neck, chest, abdomen, and pelvis did not reveal lymphadenopathy.  This does not need to be repeated unless there is suspicion of progression of disease. Return to clinic in 6 months with repeat laboratory work and further evaluation.  2. Nose lesion: Patient has follow-up with dermatology in the next week.   Patient expressed understanding and was in agreement with this plan. She also understands that She can call clinic at any time with any questions, concerns, or complaints.    Lloyd Huger, MD   05/04/2016 10:25 AM

## 2016-05-04 ENCOUNTER — Inpatient Hospital Stay (HOSPITAL_BASED_OUTPATIENT_CLINIC_OR_DEPARTMENT_OTHER): Payer: Medicare Other | Admitting: Oncology

## 2016-05-04 ENCOUNTER — Inpatient Hospital Stay: Payer: Medicare Other | Attending: Oncology

## 2016-05-04 VITALS — BP 116/77 | HR 75 | Temp 98.0°F

## 2016-05-04 DIAGNOSIS — G20 Parkinson's disease: Secondary | ICD-10-CM | POA: Insufficient documentation

## 2016-05-04 DIAGNOSIS — I10 Essential (primary) hypertension: Secondary | ICD-10-CM | POA: Diagnosis not present

## 2016-05-04 DIAGNOSIS — C9110 Chronic lymphocytic leukemia of B-cell type not having achieved remission: Secondary | ICD-10-CM

## 2016-05-04 DIAGNOSIS — Z8542 Personal history of malignant neoplasm of other parts of uterus: Secondary | ICD-10-CM

## 2016-05-04 DIAGNOSIS — L989 Disorder of the skin and subcutaneous tissue, unspecified: Secondary | ICD-10-CM | POA: Diagnosis not present

## 2016-05-04 DIAGNOSIS — M1990 Unspecified osteoarthritis, unspecified site: Secondary | ICD-10-CM | POA: Insufficient documentation

## 2016-05-04 DIAGNOSIS — K219 Gastro-esophageal reflux disease without esophagitis: Secondary | ICD-10-CM | POA: Insufficient documentation

## 2016-05-04 LAB — CBC WITH DIFFERENTIAL/PLATELET
BASOS PCT: 1 %
Basophils Absolute: 0.2 10*3/uL — ABNORMAL HIGH (ref 0–0.1)
Eosinophils Absolute: 0.1 10*3/uL (ref 0–0.7)
Eosinophils Relative: 0 %
HEMATOCRIT: 39.5 % (ref 35.0–47.0)
HEMOGLOBIN: 12.7 g/dL (ref 12.0–16.0)
LYMPHS PCT: 91 %
Lymphs Abs: 39 10*3/uL — ABNORMAL HIGH (ref 1.0–3.6)
MCH: 30.2 pg (ref 26.0–34.0)
MCHC: 32.1 g/dL (ref 32.0–36.0)
MCV: 94 fL (ref 80.0–100.0)
MONO ABS: 0.2 10*3/uL (ref 0.2–0.9)
MONOS PCT: 1 %
NEUTROS ABS: 2.8 10*3/uL (ref 1.4–6.5)
NEUTROS PCT: 7 %
Platelets: 199 10*3/uL (ref 150–440)
RBC: 4.21 MIL/uL (ref 3.80–5.20)
RDW: 14.9 % — ABNORMAL HIGH (ref 11.5–14.5)
WBC: 42.4 10*3/uL — ABNORMAL HIGH (ref 3.6–11.0)

## 2016-11-02 ENCOUNTER — Other Ambulatory Visit: Payer: Medicare Other

## 2016-11-02 ENCOUNTER — Ambulatory Visit: Payer: Medicare Other | Admitting: Oncology

## 2016-11-14 NOTE — Progress Notes (Signed)
[place]  Telephone:(336) [phone] [phone]  ID: Rachael Pham OB: [birth year]  MR#: [id number]  BHA#:[id number]  No care team member to display  CHIEF COMPLAINT: CLL  INTERVAL HISTORY: Patient returns to clinic today for repeat laboratory work and further evaluation.  She continues to feel well and remains asymptomatic.  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 She denies any recent fevers. She has no night sweats.  She has a good appetite and denies weight loss.  She has no neurologic complaints.  She denies any pain.  She has no neurologic complaints.  She denies any chest pain, shortness of breath, or cough.  She has no nausea, vomiting, constipation, or diarrhea.  She has no urinary complaints. Patient has been going to dermatology regarding areas on her face. Patient offers no specific complaints today.  REVIEW OF SYSTEMS:   Review of Systems  Constitutional: Positive for malaise/fatigue. Negative for fever and weight loss.  HENT: Negative.  Negative for nosebleeds and sinus pain.   Respiratory: Negative.  Negative for cough and shortness of breath.   Cardiovascular: Negative.  Negative for chest pain and leg swelling.  Gastrointestinal: Negative.  Negative for abdominal pain.  Genitourinary: Negative.   Musculoskeletal: Positive for joint pain. Negative for back pain.  Neurological: Positive for weakness. Negative for sensory change.  Psychiatric/Behavioral: Negative.  The patient is not nervous/anxious.     As per HPI. Otherwise, a complete review of systems is negative.  PAST MEDICAL HISTORY: Past Medical History:  Diagnosis Date  . GERD (gastroesophageal reflux disease)   . Hypertension   . Osteoarthritis   . 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 Parkinson disease ([place])   . 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 Uterine cancer ([place])     PAST SURGICAL HISTORY: Past Surgical  History:  Procedure Laterality Date  . CHOLECYSTECTOMY    . KNEE SURGERY    . ORIF HIP FRACTURE    . TONSILLECTOMY      FAMILY HISTORY: Reviewed and unchanged. No reported history of malignancy or chronic disease.     ADVANCED DIRECTIVES:    HEALTH MAINTENANCE: Social History  Substance Use Topics  . Smoking status: Never Smoker  . Smokeless tobacco: Never Used  . Alcohol use No     Allergies  Allergen Reactions  . Penicillins   . Sulfa Antibiotics     Current Outpatient Prescriptions  Medication Sig Dispense Refill  . acetaminophen (TYLENOL) 325 MG tablet Take 650 mg by mouth every 4 (four) hours as needed.    Marland Kitchen alendronate (FOSAMAX) 70 MG tablet Take 70 mg by mouth once a week. Take with a full glass of water on an empty stomach.    . bacitracin-polymyxin b (POLYSPORIN) ointment Apply topically 2 (two) times daily.    . calcium-vitamin D (OSCAL WITH D) 500-200 MG-UNIT tablet Take 1 tablet by mouth.    . gabapentin (NEURONTIN) 300 MG capsule Take 300 mg by mouth 2 (two) times daily. Reported on 10/28/2015    . magnesium hydroxide (MILK OF MAGNESIA) 400 MG/5ML suspension Take 30 mLs by mouth every other [place].    Marland Kitchen omeprazole (PRILOSEC) 20 MG capsule Take 20 mg by mouth daily.    Marland Kitchen senna (SENOKOT) 8.6 MG tablet Take 2 tablets by mouth daily.    Vladimir Faster Glycol-Propyl Glycol (SYSTANE) 0.4-0.3 % GEL ophthalmic gel Place 1 application into both eyes.    Marland Kitchen  psyllium (METAMUCIL) 58.6 % powder Take 1 packet by mouth daily.     No current facility-administered medications for this visit.     OBJECTIVE: Vitals:   11/16/16 1359  Pulse: 96  Temp: (!) 96.2 F (35.7 C)     There is no height or weight on file to calculate BMI.    ECOG FS:2 - Symptomatic, <50% confined to bed  General: Well-developed, well-nourished, no acute distress. Sitting in a wheelchair Eyes: Pink conjunctiva, anicteric sclera. HEENT: Well healed surgical scar.s Lungs: Clear to auscultation  bilaterally. Heart: Regular rate and rhythm. No rubs, murmurs, or gallops. Abdomen: Soft, nontender, nondistended. No organomegaly noted, normoactive bowel sounds. Musculoskeletal: No edema, cyanosis, or clubbing. Neuro: Alert, answering all questions appropriately. Cranial nerves grossly intact. Skin: No rashes or petechiae noted. Psych: Normal affect. No cervical, axillary or supraclavicular lymphadenopathy.   LAB RESULTS:  Lab Results  Component Value Date   NA 134 (L) 07/22/2012   K 4.3 07/22/2012   CL 97 (L) 07/22/2012   CO2 32 07/22/2012   GLUCOSE 90 07/22/2012   BUN 16 07/22/2012   CREATININE 0.64 07/22/2012   CALCIUM 8.7 07/22/2012   PROT 6.5 07/22/2012   ALBUMIN 3.4 07/22/2012   AST 25 07/22/2012   ALT 19 07/22/2012   ALKPHOS 114 07/22/2012   BILITOT 0.3 07/22/2012   GFRNONAA >60 07/22/2012   GFRAA >60 07/22/2012    Lab Results  Component Value Date   WBC 53.6 (HH) 11/16/2016   NEUTROABS 3.1 11/16/2016   HGB 12.4 11/16/2016   HCT 38.4 11/16/2016   MCV 92.6 11/16/2016   PLT 206 11/16/2016     STUDIES: No results found.  Assessment/PLAN:    1.  CLL: Flow cytometry confirmed the results. Patient's white blood cell count has ranged from 34-53.6 since April 2014. Patient is a Rai stage 0, therefore no intervention needed at this time.  Previously, CT scan of the neck, chest, abdomen, and pelvis did not reveal lymphadenopathy.  This does not need to be repeated unless there is suspicion of progression of disease. Return to clinic in 6 months with repeat laboratory work and further evaluation.  2. Nose lesion: Resolved. 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  Approximately 20 minutes was spent in discussion of which greater than 50% was consultation.  Patient expressed understanding and was in agreement with plan. She also understands that She can call clinic at any time with any questions, concerns, or complaints.    Lloyd Huger, MD   11/16/2016 2:35 PM

## 2016-11-16 ENCOUNTER — Inpatient Hospital Stay: Payer: Medicare Other | Attending: Oncology | Admitting: Oncology

## 2016-11-16 ENCOUNTER — Inpatient Hospital Stay: Payer: Medicare Other

## 2016-11-16 VITALS — HR 96 | Temp 96.2°F | Wt 156.1 lb

## 2016-11-16 DIAGNOSIS — C9110 Chronic lymphocytic leukemia of B-cell type not having achieved remission: Secondary | ICD-10-CM | POA: Diagnosis present

## 2016-11-16 DIAGNOSIS — M1990 Unspecified osteoarthritis, unspecified site: Secondary | ICD-10-CM | POA: Insufficient documentation

## 2016-11-16 DIAGNOSIS — G20 Parkinson's disease: Secondary | ICD-10-CM | POA: Insufficient documentation

## 2016-11-16 DIAGNOSIS — Z8542 Personal history of malignant neoplasm of other parts of uterus: Secondary | ICD-10-CM | POA: Insufficient documentation

## 2016-11-16 DIAGNOSIS — Z79899 Other long term (current) drug therapy: Secondary | ICD-10-CM | POA: Insufficient documentation

## 2016-11-16 DIAGNOSIS — I10 Essential (primary) hypertension: Secondary | ICD-10-CM | POA: Insufficient documentation

## 2016-11-16 DIAGNOSIS — K219 Gastro-esophageal reflux disease without esophagitis: Secondary | ICD-10-CM | POA: Insufficient documentation

## 2016-11-16 LAB — CBC WITH DIFFERENTIAL/PLATELET
BASOS PCT: 1 %
Basophils Absolute: 0.3 10*3/uL — ABNORMAL HIGH (ref 0–0.1)
EOS ABS: 0.2 10*3/uL (ref 0–0.7)
EOS PCT: 0 %
HCT: 38.4 % (ref 35.0–47.0)
Hemoglobin: 12.4 g/dL (ref 12.0–16.0)
Lymphocytes Relative: 92 %
Lymphs Abs: 49.6 10*3/uL — ABNORMAL HIGH (ref 1.0–3.6)
MCH: 29.9 pg (ref 26.0–34.0)
MCHC: 32.3 g/dL (ref 32.0–36.0)
MCV: 92.6 fL (ref 80.0–100.0)
MONO ABS: 0.4 10*3/uL (ref 0.2–0.9)
MONOS PCT: 1 %
Neutro Abs: 3.1 10*3/uL (ref 1.4–6.5)
Neutrophils Relative %: 6 %
PLATELETS: 206 10*3/uL (ref 150–440)
RBC: 4.15 MIL/uL (ref 3.80–5.20)
RDW: 14.9 % — AB (ref 11.5–14.5)
WBC: 53.6 10*3/uL (ref 3.6–11.0)

## 2016-11-16 NOTE — Progress Notes (Signed)
Here for fu

## 2017-05-17 ENCOUNTER — Other Ambulatory Visit: Payer: Self-pay

## 2017-05-17 DIAGNOSIS — C9110 Chronic lymphocytic leukemia of B-cell type not having achieved remission: Secondary | ICD-10-CM

## 2017-05-22 NOTE — Progress Notes (Signed)
[name]  [phone]) [phone] [phone]  ID: Rachael Pham OB: [birth year]  MR#: [id number]  KVQ#:[id number]  Patient Care Team: Rachael Caprice, DO as PCP - General (Family Medicine)  CHIEF COMPLAINT: CLL  INTERVAL HISTORY: Patient returns to clinic today for repeat laboratory work and further evaluation.  She continues to feel well and remains at her baseline. She denies any recent fevers or night sweats.  She has a good appetite and denies weight loss.  She has no neurologic complaints.  She denies any pain. She denies any chest pain, shortness of breath, or cough.  She has no nausea, vomiting, constipation, or diarrhea.  She has no urinary complaints. Patient offers no specific complaints today.  REVIEW OF SYSTEMS:   Review of Systems  Constitutional: Positive for malaise/fatigue. Negative for fever and weight loss.  HENT: Negative.  Negative for nosebleeds and sinus pain.   Respiratory: Negative.  Negative for cough and shortness of breath.   Cardiovascular: Negative.  Negative for chest pain and leg swelling.  Gastrointestinal: Negative.  Negative for abdominal pain.  Genitourinary: Negative.   Musculoskeletal: Positive for joint pain. Negative for back pain.  Neurological: Positive for weakness. Negative for sensory change.  Psychiatric/Behavioral: Negative.  The patient is not nervous/anxious.     As per HPI. Otherwise, a complete review of systems is negative.  PAST MEDICAL HISTORY: Past Medical History:  Diagnosis Date  . GERD (gastroesophageal reflux disease)   . Hypertension   . Osteoarthritis   . 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 Parkinson disease ([place])   . 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 Uterine cancer ([place])     PAST SURGICAL HISTORY: Reviewed and unchanged. FAMILY HISTORY: Reviewed and unchanged. No reported history of malignancy or chronic disease.     ADVANCED DIRECTIVES:    HEALTH MAINTENANCE: Social  History   Tobacco Use  . Smoking status: Never Smoker  . Smokeless tobacco: Never Used  Substance Use Topics  . Alcohol use: No  . Drug use: No     Allergies  Allergen Reactions  . Penicillins   . Sulfa Antibiotics     Current Outpatient Medications  Medication Sig Dispense Refill  . acetaminophen (TYLENOL) 325 MG tablet Take 650 mg by mouth every 4 (four) hours as needed.    Marland Kitchen alendronate (FOSAMAX) 70 MG tablet Take 70 mg by mouth once a week. Take with a full glass of water on an empty stomach.    . bacitracin-polymyxin b (POLYSPORIN) ointment Apply topically 2 (two) times daily.    . Calcium Carb-Cholecalciferol (CALCIUM-VITAMIN D) 600-400 MG-UNIT TABS Take by mouth.    . Cholecalciferol (VITAMIN D3) 2000 units capsule Take 4,000 Units by mouth daily.    Marland Kitchen gabapentin (NEURONTIN) 300 MG capsule Take 300 mg by mouth 2 (two) times daily. Reported on 10/28/2015    . loratadine (CLARITIN) 10 MG tablet Take 10 mg by mouth daily.    . magnesium hydroxide (MILK OF MAGNESIA) 400 MG/5ML suspension Take 30 mLs by mouth every other [place].    . senna (SENOKOT) 8.6 MG tablet Take 2 tablets by mouth daily.     No current facility-administered medications for this visit.     OBJECTIVE: Vitals:   05/24/17 1036  BP: 108/70  Pulse: 78  Resp: 18  Temp: 97.9 F (36.6 C)     There is no height or weight on file to calculate BMI.    ECOG FS:2 - Symptomatic, <50%  confined to bed  General: Well-developed, well-nourished, no acute distress. Sitting in a wheelchair Eyes: Pink conjunctiva, anicteric sclera. Lungs: Clear to auscultation bilaterally. Heart: Regular rate and rhythm. No rubs, murmurs, or gallops. Abdomen: Soft, nontender, nondistended. No organomegaly noted, normoactive bowel sounds. Musculoskeletal: No edema, cyanosis, or clubbing. Neuro: Alert, answering all questions appropriately. Cranial nerves grossly intact. Skin: No rashes or petechiae noted. Psych: Normal  affect. Lymphatics: No cervical, axillary or supraclavicular lymphadenopathy.   LAB RESULTS:  Lab Results  Component Value Date   NA 134 (L) 07/22/2012   K 4.3 07/22/2012   CL 97 (L) 07/22/2012   CO2 32 07/22/2012   GLUCOSE 90 07/22/2012   BUN 16 07/22/2012   CREATININE 0.64 07/22/2012   CALCIUM 8.7 07/22/2012   PROT 6.5 07/22/2012   ALBUMIN 3.4 07/22/2012   AST 25 07/22/2012   ALT 19 07/22/2012   ALKPHOS 114 07/22/2012   BILITOT 0.3 07/22/2012   GFRNONAA >60 07/22/2012   GFRAA >60 07/22/2012    Lab Results  Component Value Date   WBC 52.7 (HH) 05/24/2017   NEUTROABS 2.5 05/24/2017   HGB 12.9 05/24/2017   HCT 39.9 05/24/2017   MCV 96.2 05/24/2017   PLT 185 05/24/2017     STUDIES: No results found.  Assessment/PLAN:    1.  CLL: Flow cytometry confirmed the results. Patient's white blood cell count has ranged from 34-53.6 since April 2014. Patient is a Rai stage 0, therefore no intervention needed at this time.  Previously, CT scan of the neck, chest, abdomen, and pelvis did not reveal lymphadenopathy.  This does not need to be repeated unless there is suspicion of progression of disease. Return to clinic in 6 months with repeat laboratory work and further evaluation.  2. Nose lesion: Resolved. 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  Approximately 20 minutes was spent in discussion of which greater than 50% was consultation.  Patient expressed understanding and was in agreement with plan. She also understands that She can call clinic at any time with any questions, concerns, or complaints.    Lloyd Huger, MD   05/24/2017 12:09 PM

## 2017-05-24 ENCOUNTER — Inpatient Hospital Stay: Payer: Medicare Other

## 2017-05-24 ENCOUNTER — Inpatient Hospital Stay: Payer: Medicare Other | Attending: Oncology | Admitting: Oncology

## 2017-05-24 VITALS — BP 108/70 | HR 78 | Temp 97.9°F | Resp 18 | Wt 152.0 lb

## 2017-05-24 DIAGNOSIS — K219 Gastro-esophageal reflux disease without esophagitis: Secondary | ICD-10-CM | POA: Insufficient documentation

## 2017-05-24 DIAGNOSIS — M1990 Unspecified osteoarthritis, unspecified site: Secondary | ICD-10-CM | POA: Diagnosis not present

## 2017-05-24 DIAGNOSIS — C9110 Chronic lymphocytic leukemia of B-cell type not having achieved remission: Secondary | ICD-10-CM

## 2017-05-24 DIAGNOSIS — Z8542 Personal history of malignant neoplasm of other parts of uterus: Secondary | ICD-10-CM | POA: Diagnosis not present

## 2017-05-24 DIAGNOSIS — Z79899 Other long term (current) drug therapy: Secondary | ICD-10-CM | POA: Insufficient documentation

## 2017-05-24 DIAGNOSIS — I10 Essential (primary) hypertension: Secondary | ICD-10-CM | POA: Diagnosis not present

## 2017-05-24 DIAGNOSIS — G20 Parkinson's disease: Secondary | ICD-10-CM | POA: Insufficient documentation

## 2017-05-24 LAB — CBC WITH DIFFERENTIAL/PLATELET
Basophils Absolute: 0.1 10*3/uL (ref 0–0.1)
Basophils Relative: 0 %
Eosinophils Absolute: 0.3 10*3/uL (ref 0–0.7)
Eosinophils Relative: 1 %
HCT: 39.9 % (ref 35.0–47.0)
Hemoglobin: 12.9 g/dL (ref 12.0–16.0)
LYMPHS ABS: 49.3 10*3/uL — AB (ref 1.0–3.6)
LYMPHS PCT: 93 %
MCH: 31 pg (ref 26.0–34.0)
MCHC: 32.2 g/dL (ref 32.0–36.0)
MCV: 96.2 fL (ref 80.0–100.0)
MONO ABS: 0.5 10*3/uL (ref 0.2–0.9)
MONOS PCT: 1 %
NEUTROS ABS: 2.5 10*3/uL (ref 1.4–6.5)
Neutrophils Relative %: 5 %
Platelets: 185 10*3/uL (ref 150–440)
RBC: 4.15 MIL/uL (ref 3.80–5.20)
RDW: 15 % — AB (ref 11.5–14.5)
WBC: 52.7 10*3/uL (ref 3.6–11.0)

## 2017-11-24 NOTE — Progress Notes (Deleted)
[place]  [phone] [phone]  ID: Rachael Pham OB: [birth year]  MR#: [id number]  OEU#:[id number]  Patient Care Team: Renata Caprice, DO as PCP - General (Family Medicine)  CHIEF COMPLAINT: CLL  INTERVAL HISTORY: Patient returns to clinic today for repeat laboratory work and further evaluation.  She continues to feel well and remains at her baseline. She denies any recent fevers or night sweats.  She has a good appetite and denies weight loss.  She has no neurologic complaints.  She denies any pain. She denies any chest pain, shortness of breath, or cough.  She has no nausea, vomiting, constipation, or diarrhea.  She has no urinary complaints. Patient offers no specific complaints today.  REVIEW OF SYSTEMS:   Review of Systems  Constitutional: Positive for malaise/fatigue. Negative for fever and weight loss.  HENT: Negative.  Negative for nosebleeds and sinus pain.   Respiratory: Negative.  Negative for cough and shortness of breath.   Cardiovascular: Negative.  Negative for chest pain and leg swelling.  Gastrointestinal: Negative.  Negative for abdominal pain.  Genitourinary: Negative.   Musculoskeletal: Positive for joint pain. Negative for back pain.  Neurological: Positive for weakness. Negative for sensory change.  Psychiatric/Behavioral: Negative.  The patient is not nervous/anxious.     As per HPI. Otherwise, a complete review of systems is negative.  PAST MEDICAL HISTORY: Past Medical History:  Diagnosis Date  . GERD (gastroesophageal reflux disease)   . Hypertension   . Osteoarthritis   . 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 Parkinson disease ([place])   . 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 Uterine cancer ([place])     PAST SURGICAL HISTORY: Reviewed and unchanged. FAMILY HISTORY: Reviewed and unchanged. No reported history of malignancy or chronic disease.     ADVANCED DIRECTIVES:    HEALTH MAINTENANCE: Social  History   Tobacco Use  . Smoking status: Never Smoker  . Smokeless tobacco: Never Used  Substance Use Topics  . Alcohol use: No  . Drug use: No     Allergies  Allergen Reactions  . Penicillins   . Sulfa Antibiotics     Current Outpatient Medications  Medication Sig Dispense Refill  . acetaminophen (TYLENOL) 325 MG tablet Take 650 mg by mouth every 4 (four) hours as needed.    Marland Kitchen alendronate (FOSAMAX) 70 MG tablet Take 70 mg by mouth once a week. Take with a full glass of water on an empty stomach.    . bacitracin-polymyxin b (POLYSPORIN) ointment Apply topically 2 (two) times daily.    . Calcium Carb-Cholecalciferol (CALCIUM-VITAMIN D) 600-400 MG-UNIT TABS Take by mouth.    . Cholecalciferol (VITAMIN D3) 2000 units capsule Take 4,000 Units by mouth daily.    Marland Kitchen gabapentin (NEURONTIN) 300 MG capsule Take 300 mg by mouth 2 (two) times daily. Reported on 10/28/2015    . loratadine (CLARITIN) 10 MG tablet Take 10 mg by mouth daily.    . magnesium hydroxide (MILK OF MAGNESIA) 400 MG/5ML suspension Take 30 mLs by mouth every other [place].    . senna (SENOKOT) 8.6 MG tablet Take 2 tablets by mouth daily.     No current facility-administered medications for this visit.     OBJECTIVE: There were no vitals filed for this visit.   There is no height or weight on file to calculate BMI.    ECOG FS:2 - Symptomatic, <50% confined to bed  General: Well-developed, well-nourished, no acute distress. Sitting in a wheelchair  Eyes: Pink conjunctiva, anicteric sclera. Lungs: Clear to auscultation bilaterally. Heart: Regular rate and rhythm. No rubs, murmurs, or gallops. Abdomen: Soft, nontender, nondistended. No organomegaly noted, normoactive bowel sounds. Musculoskeletal: No edema, cyanosis, or clubbing. Neuro: Alert, answering all questions appropriately. Cranial nerves grossly intact. Skin: No rashes or petechiae noted. Psych: Normal affect. Lymphatics: No cervical, axillary or supraclavicular  lymphadenopathy.   LAB RESULTS:  Lab Results  Component Value Date   NA 134 (L) 07/22/2012   K 4.3 07/22/2012   CL 97 (L) 07/22/2012   CO2 32 07/22/2012   GLUCOSE 90 07/22/2012   BUN 16 07/22/2012   CREATININE 0.64 07/22/2012   CALCIUM 8.7 07/22/2012   PROT 6.5 07/22/2012   ALBUMIN 3.4 07/22/2012   AST 25 07/22/2012   ALT 19 07/22/2012   ALKPHOS 114 07/22/2012   BILITOT 0.3 07/22/2012   GFRNONAA >60 07/22/2012   GFRAA >60 07/22/2012    Lab Results  Component Value Date   WBC 52.7 (HH) 05/24/2017   NEUTROABS 2.5 05/24/2017   HGB 12.9 05/24/2017   HCT 39.9 05/24/2017   MCV 96.2 05/24/2017   PLT 185 05/24/2017     STUDIES: No results found.  Assessment/PLAN:    1.  CLL: Flow cytometry confirmed the results. Patient's white blood cell count has ranged from 34-53.6 since April 2014. Patient is a Rai stage 0, therefore no intervention needed at this time.  Previously, CT scan of the neck, chest, abdomen, and pelvis did not reveal lymphadenopathy.  This does not need to be repeated unless there is suspicion of progression of disease. Return to clinic in 6 months with repeat laboratory work and further evaluation.  2. Nose lesion: Resolved. 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  Approximately 20 minutes was spent in discussion of which greater than 50% was consultation.  Patient expressed understanding and was in agreement with plan. She also understands that She can call clinic at any time with any questions, concerns, or complaints.    Lloyd Huger, MD   11/24/2017 11:47 PM

## 2017-11-29 ENCOUNTER — Other Ambulatory Visit: Payer: Medicare Other

## 2017-11-29 ENCOUNTER — Ambulatory Visit: Payer: Medicare Other | Admitting: Oncology

## 2017-12-22 NOTE — Progress Notes (Signed)
[place]  [phone] [phone]  ID: Rachael Pham OB: [birth year]  MR#: [id number]  CBS#:[id number]  Patient Care Team: Renata Caprice, DO as PCP - General (Family Medicine)  CHIEF COMPLAINT: CLL  INTERVAL HISTORY: Patient returns to clinic today for repeat laboratory work and routine six-month evaluation.  She continues to feel well and at her baseline.  She continues to have chronic weakness and fatigue and requires a wheelchair.  She has no neurologic complaints.  She denies any recent fevers or night sweats.  She has a good appetite and denies weight loss. She denies any pain. She denies any chest pain, shortness of breath, or cough.  She has no nausea, vomiting, constipation, or diarrhea.  She has no urinary complaints.  Patient offers no specific complaints today.  REVIEW OF SYSTEMS:   Review of Systems  Constitutional: Positive for malaise/fatigue. Negative for fever and weight loss.  HENT: Negative.  Negative for nosebleeds and sinus pain.   Respiratory: Negative.  Negative for cough and shortness of breath.   Cardiovascular: Negative.  Negative for chest pain and leg swelling.  Gastrointestinal: Negative.  Negative for abdominal pain and constipation.  Genitourinary: Negative.  Negative for dysuria.  Musculoskeletal: Positive for joint pain. Negative for back pain.  Skin: Negative.  Negative for rash.  Neurological: Positive for weakness. Negative for sensory change and headaches.  Psychiatric/Behavioral: Negative.  The patient is not nervous/anxious.     As per HPI. Otherwise, a complete review of systems is negative.  PAST MEDICAL HISTORY: Past Medical History:  Diagnosis Date  . GERD (gastroesophageal reflux disease)   . Hypertension   . Osteoarthritis   . 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 Parkinson disease ([place])   . Unspecified convulsions ([place])   . Uterine cancer ([place])     PAST SURGICAL HISTORY: Reviewed and  unchanged. FAMILY HISTORY: Reviewed and unchanged. No reported history of malignancy or chronic disease.     ADVANCED DIRECTIVES:    HEALTH MAINTENANCE: Social History   Tobacco Use  . Smoking status: Never Smoker  . Smokeless tobacco: Never Used  Substance Use Topics  . Alcohol use: No  . Drug use: No     Allergies  Allergen Reactions  . Penicillins   . Sulfa Antibiotics     Current Outpatient Medications  Medication Sig Dispense Refill  . acetaminophen (TYLENOL) 325 MG tablet Take 650 mg by mouth every 4 (four) hours as needed.    Marland Kitchen alendronate (FOSAMAX) 70 MG tablet Take 70 mg by mouth once a week. Take with a full glass of water on an empty stomach.    . bacitracin-polymyxin b (POLYSPORIN) ointment Apply topically 2 (two) times daily.    . Calcium Carb-Cholecalciferol (CALCIUM-VITAMIN D) 600-400 MG-UNIT TABS Take by mouth.    . Cholecalciferol (VITAMIN D3) 2000 units capsule Take 4,000 Units by mouth daily.    Marland Kitchen gabapentin (NEURONTIN) 300 MG capsule Take 300 mg by mouth 2 (two) times daily. Reported on 10/28/2015    . loratadine (CLARITIN) 10 MG tablet Take 10 mg by mouth daily.    . magnesium hydroxide (MILK OF MAGNESIA) 400 MG/5ML suspension Take 30 mLs by mouth every other [place].    . senna (SENOKOT) 8.6 MG tablet Take 2 tablets by mouth daily.     No current facility-administered medications for this visit.     OBJECTIVE: Vitals:   12/27/17 1457  BP: 134/72  Pulse: 68  Resp: 18  Temp: 97.6 F (36.4 C)     There is no height or weight on file to calculate BMI.    ECOG FS:2 - Symptomatic, <50% confined to bed  General: Well-developed, well-nourished, no acute distress.  Sitting in a wheelchair. Eyes: Pink conjunctiva, anicteric sclera. Lungs: Clear to auscultation bilaterally. Heart: Regular rate and rhythm. No rubs, murmurs, or gallops. Abdomen: Soft, nontender, nondistended. No organomegaly noted, normoactive bowel sounds. Musculoskeletal: No edema,  cyanosis, or clubbing. Neuro: Alert, answering all questions appropriately. Cranial nerves grossly intact. Skin: No rashes or petechiae noted. Psych: Normal affect.   LAB RESULTS:  Lab Results  Component Value Date   NA 134 (L) 07/22/2012   K 4.3 07/22/2012   CL 97 (L) 07/22/2012   CO2 32 07/22/2012   GLUCOSE 90 07/22/2012   BUN 16 07/22/2012   CREATININE 0.64 07/22/2012   CALCIUM 8.7 07/22/2012   PROT 6.5 07/22/2012   ALBUMIN 3.4 07/22/2012   AST 25 07/22/2012   ALT 19 07/22/2012   ALKPHOS 114 07/22/2012   BILITOT 0.3 07/22/2012   GFRNONAA >60 07/22/2012   GFRAA >60 07/22/2012    Lab Results  Component Value Date   WBC 55.9 (HH) 12/27/2017   NEUTROABS 2.8 12/27/2017   HGB 12.9 12/27/2017   HCT 40.3 12/27/2017   MCV 95.9 12/27/2017   PLT 199 12/27/2017     STUDIES: No results found.  Assessment/PLAN:    1.  CLL: Flow cytometry confirmed the results. Patient's white blood cell count has ranged from 34-53.6 since April 2014.  Today's result is 55.9.  Patient is a Rai stage 0, therefore no intervention needed at this time.  Previously, CT scan of the neck, chest, abdomen, and pelvis did not reveal lymphadenopathy.  This does not need to be repeated unless there is suspicion of progression of disease.  Return to clinic in 6 months with repeat laboratory for further evaluation.   2. Nose lesion: Resolved. 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  I spent a total of 20 minutes face-to-face with the patient of which greater than 50% of the visit was spent in counseling and coordination of care as detailed above.   Patient expressed understanding and was in agreement with plan. She also understands that She can call clinic at any time with any questions, concerns, or complaints.    Lloyd Huger, MD   01/02/2018 2:33 PM

## 2017-12-27 ENCOUNTER — Inpatient Hospital Stay (HOSPITAL_BASED_OUTPATIENT_CLINIC_OR_DEPARTMENT_OTHER): Payer: Medicare Other | Admitting: Oncology

## 2017-12-27 ENCOUNTER — Encounter: Payer: Self-pay | Admitting: Oncology

## 2017-12-27 ENCOUNTER — Inpatient Hospital Stay: Payer: Medicare Other | Attending: Oncology

## 2017-12-27 VITALS — BP 134/72 | HR 68 | Temp 97.6°F | Resp 18 | Wt 152.0 lb

## 2017-12-27 DIAGNOSIS — G20 Parkinson's disease: Secondary | ICD-10-CM | POA: Diagnosis not present

## 2017-12-27 DIAGNOSIS — C9190 Lymphoid leukemia, unspecified not having achieved remission: Secondary | ICD-10-CM | POA: Diagnosis not present

## 2017-12-27 DIAGNOSIS — M1990 Unspecified osteoarthritis, unspecified site: Secondary | ICD-10-CM | POA: Diagnosis not present

## 2017-12-27 DIAGNOSIS — Z79899 Other long term (current) drug therapy: Secondary | ICD-10-CM | POA: Insufficient documentation

## 2017-12-27 DIAGNOSIS — I10 Essential (primary) hypertension: Secondary | ICD-10-CM | POA: Diagnosis not present

## 2017-12-27 DIAGNOSIS — C9110 Chronic lymphocytic leukemia of B-cell type not having achieved remission: Secondary | ICD-10-CM | POA: Insufficient documentation

## 2017-12-27 DIAGNOSIS — K219 Gastro-esophageal reflux disease without esophagitis: Secondary | ICD-10-CM | POA: Diagnosis not present

## 2017-12-27 LAB — CBC WITH DIFFERENTIAL/PLATELET
BASOS ABS: 0.1 10*3/uL (ref 0–0.1)
BASOS PCT: 0 %
Eosinophils Absolute: 0.2 10*3/uL (ref 0–0.7)
Eosinophils Relative: 0 %
HCT: 40.3 % (ref 35.0–47.0)
HEMOGLOBIN: 12.9 g/dL (ref 12.0–16.0)
LYMPHS PCT: 94 %
Lymphs Abs: 52.5 10*3/uL — ABNORMAL HIGH (ref 1.0–3.6)
MCH: 30.6 pg (ref 26.0–34.0)
MCHC: 31.9 g/dL — ABNORMAL LOW (ref 32.0–36.0)
MCV: 95.9 fL (ref 80.0–100.0)
MONOS PCT: 1 %
Monocytes Absolute: 0.3 10*3/uL (ref 0.2–0.9)
NEUTROS ABS: 2.8 10*3/uL (ref 1.4–6.5)
NEUTROS PCT: 5 %
Platelets: 199 10*3/uL (ref 150–440)
RBC: 4.21 MIL/uL (ref 3.80–5.20)
RDW: 15.1 % — ABNORMAL HIGH (ref 11.5–14.5)
WBC: 55.9 10*3/uL (ref 3.6–11.0)

## 2017-12-27 NOTE — Progress Notes (Signed)
Pt in for follow up, denies any concerns today. 

## 2018-06-26 ENCOUNTER — Other Ambulatory Visit: Payer: Self-pay | Admitting: *Deleted

## 2018-06-26 DIAGNOSIS — C9110 Chronic lymphocytic leukemia of B-cell type not having achieved remission: Secondary | ICD-10-CM

## 2018-06-26 NOTE — Progress Notes (Signed)
cbc

## 2018-06-28 NOTE — Progress Notes (Signed)
[place]  [phone]) [phone] [phone]  ID: Triston Lisanti Davison OB: [birth year]  MR#: [id number]  CXK#:[id number]  Patient Care Team: Renata Caprice, DO as PCP - General (Family Medicine)  CHIEF COMPLAINT: CLL  INTERVAL HISTORY: Patient returns to clinic today for repeat laboratory work and routine 69-month evaluation.  She continues to feel well and is at her baseline. She continues to have chronic weakness and fatigue and requires a wheelchair.  She has no neurologic complaints.  She denies any recent fevers or night sweats.  She has a good appetite and denies weight loss. She denies any pain. She denies any chest pain, shortness of breath, or cough.  She has no nausea, vomiting, constipation, or diarrhea.  She has no urinary complaints.  Patient offers no specific complaints today.  REVIEW OF SYSTEMS:   Review of Systems  Constitutional: Positive for malaise/fatigue. Negative for fever and weight loss.  HENT: Negative.  Negative for nosebleeds and sinus pain.   Respiratory: Negative.  Negative for cough and shortness of breath.   Cardiovascular: Negative.  Negative for chest pain and leg swelling.  Gastrointestinal: Negative.  Negative for abdominal pain and constipation.  Genitourinary: Negative.  Negative for dysuria.  Musculoskeletal: Positive for joint pain. Negative for back pain.  Skin: Negative.  Negative for rash.  Neurological: Positive for weakness. Negative for sensory change and headaches.  Psychiatric/Behavioral: Negative.  The patient is not nervous/anxious.     As per HPI. Otherwise, a complete review of systems is negative.  PAST MEDICAL HISTORY: Past Medical History:  Diagnosis Date  . GERD (gastroesophageal reflux disease)   . Hypertension   . Osteoarthritis   . 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 Parkinson disease ([place])   . Unspecified convulsions ([place])   . Uterine cancer ([place])     PAST SURGICAL HISTORY: Reviewed and  unchanged. FAMILY HISTORY: Reviewed and unchanged. No reported history of malignancy or chronic disease.     ADVANCED DIRECTIVES:    HEALTH MAINTENANCE: Social History   Tobacco Use  . Smoking status: Never Smoker  . Smokeless tobacco: Never Used  Substance Use Topics  . Alcohol use: No  . Drug use: No     Allergies  Allergen Reactions  . Penicillins   . Sulfa Antibiotics     Current Outpatient Medications  Medication Sig Dispense Refill  . acetaminophen (TYLENOL) 325 MG tablet Take 650 mg by mouth every 4 (four) hours as needed.    Marland Kitchen alendronate (FOSAMAX) 70 MG tablet Take 70 mg by mouth once a week. Take with a full glass of water on an empty stomach.    . bacitracin-polymyxin b (POLYSPORIN) ointment Apply topically 2 (two) times daily.    . Calcium Carb-Cholecalciferol (CALCIUM-VITAMIN D) 600-400 MG-UNIT TABS Take by mouth.    . Cholecalciferol (VITAMIN D3) 2000 units capsule Take 4,000 Units by mouth daily.    . furosemide (LASIX) 20 MG tablet 1 tablet.    . gabapentin (NEURONTIN) 300 MG capsule Take 300 mg by mouth 2 (two) times daily. Reported on 10/28/2015    . loratadine (CLARITIN) 10 MG tablet Take 10 mg by mouth daily.    . magnesium hydroxide (MILK OF MAGNESIA) 400 MG/5ML suspension Take 30 mLs by mouth every other [place].    . potassium chloride (K-DUR,KLOR-CON) 10 MEQ tablet Take 1 tablet by mouth 1 [place] or 1 dose.    . senna (SENOKOT) 8.6 MG tablet Take 2 tablets by mouth daily.  No current facility-administered medications for this visit.     OBJECTIVE: Vitals:   07/04/18 1031  BP: 113/78  Pulse: 72  Temp: (!) 96.7 F (35.9 C)     There is no height or weight on file to calculate BMI.    ECOG FS:2 - Symptomatic, <50% confined to bed  General: Well-developed, well-nourished, no acute distress.  Sitting in a wheelchair. Eyes: Pink conjunctiva, anicteric sclera. HEENT: Normocephalic, moist mucous membranes. Lungs: Clear to auscultation  bilaterally. Heart: Regular rate and rhythm. No rubs, murmurs, or gallops. Abdomen: Soft, nontender, nondistended. No organomegaly noted, normoactive bowel sounds. Musculoskeletal: No edema, cyanosis, or clubbing. Neuro: Alert, answering all questions appropriately. Cranial nerves grossly intact. Skin: No rashes or petechiae noted. Psych: Normal affect.  LAB RESULTS:  Lab Results  Component Value Date   NA 134 (L) 07/22/2012   K 4.3 07/22/2012   CL 97 (L) 07/22/2012   CO2 32 07/22/2012   GLUCOSE 90 07/22/2012   BUN 16 07/22/2012   CREATININE 0.64 07/22/2012   CALCIUM 8.7 07/22/2012   PROT 6.5 07/22/2012   ALBUMIN 3.4 07/22/2012   AST 25 07/22/2012   ALT 19 07/22/2012   ALKPHOS 114 07/22/2012   BILITOT 0.3 07/22/2012   GFRNONAA >60 07/22/2012   GFRAA >60 07/22/2012    Lab Results  Component Value Date   WBC 71.1 (HH) 07/04/2018   NEUTROABS 3.1 07/04/2018   HGB 13.1 07/04/2018   HCT 41.0 07/04/2018   MCV 99.3 07/04/2018   PLT 200 07/04/2018     STUDIES: No results found.  Assessment/PLAN:    1.  CLL: Flow cytometry confirmed the results.  CLL FISH panel was pending at time of dictation.  Patient's white blood cell count has ranged from 34-53.6 since April 2014.  Today's result is elevated from her baseline at 77.1.  She continues to be a stage 0, therefore no intervention is needed.  Patient expressed understanding that she may require treatment with weekly Rituxan in the near future.  Return to clinic in 3 months for laboratory work only and then in 6 months for laboratory work and further evaluation. 2. Nose lesion: Resolved. 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  I spent a total of 20 minutes face-to-face with the patient of which greater than 50% of the visit was spent in counseling and coordination of care as detailed above.   Patient expressed understanding and was in agreement with plan. She also understands that She can call clinic  at any time with any questions, concerns, or complaints.    Lloyd Huger, MD   07/04/2018 1:00 PM

## 2018-07-04 ENCOUNTER — Inpatient Hospital Stay: Payer: Medicare Other | Attending: Oncology | Admitting: Oncology

## 2018-07-04 ENCOUNTER — Other Ambulatory Visit: Payer: Self-pay

## 2018-07-04 ENCOUNTER — Inpatient Hospital Stay: Payer: Medicare Other

## 2018-07-04 VITALS — BP 113/78 | HR 72 | Temp 96.7°F

## 2018-07-04 DIAGNOSIS — C9110 Chronic lymphocytic leukemia of B-cell type not having achieved remission: Secondary | ICD-10-CM

## 2018-07-04 DIAGNOSIS — Z79899 Other long term (current) drug therapy: Secondary | ICD-10-CM

## 2018-07-04 DIAGNOSIS — I10 Essential (primary) hypertension: Secondary | ICD-10-CM | POA: Diagnosis not present

## 2018-07-04 LAB — CBC WITH DIFFERENTIAL/PLATELET
Abs Immature Granulocytes: 0.12 10*3/uL — ABNORMAL HIGH (ref 0.00–0.07)
BASOS ABS: 0.1 10*3/uL (ref 0.0–0.1)
BASOS PCT: 0 %
EOS ABS: 0.1 10*3/uL (ref 0.0–0.5)
EOS PCT: 0 %
HEMATOCRIT: 41 % (ref 36.0–46.0)
Hemoglobin: 13.1 g/dL (ref 12.0–15.0)
IMMATURE GRANULOCYTES: 0 %
LYMPHS ABS: 64 10*3/uL — AB (ref 0.7–4.0)
Lymphocytes Relative: 91 %
MCH: 31.7 pg (ref 26.0–34.0)
MCHC: 32 g/dL (ref 30.0–36.0)
MCV: 99.3 fL (ref 80.0–100.0)
Monocytes Absolute: 3.7 10*3/uL — ABNORMAL HIGH (ref 0.1–1.0)
Monocytes Relative: 5 %
NEUTROS PCT: 4 %
NRBC: 0 % (ref 0.0–0.2)
Neutro Abs: 3.1 10*3/uL (ref 1.7–7.7)
PLATELETS: 200 10*3/uL (ref 150–400)
RBC: 4.13 MIL/uL (ref 3.87–5.11)
RDW: 14.5 % (ref 11.5–15.5)
WBC: 71.1 10*3/uL — AB (ref 4.0–10.5)

## 2018-07-04 NOTE — Progress Notes (Signed)
Maritza notified via skype message re critical pt value call from lab of Joquan Lotz count 71.1, she stated she would let Dr. Grayland Ormond know.

## 2018-07-04 NOTE — Progress Notes (Signed)
Patient is here today to follow up on her thrombocytopenia. Patient stated that she had been felling well with no complaints.

## 2018-07-15 LAB — FISH HES LEUKEMIA, 4Q12 REA

## 2018-09-05 LAB — IGVH SOMATIC HYPERMUTATION

## 2018-10-03 ENCOUNTER — Inpatient Hospital Stay: Payer: Medicare Other | Attending: Oncology

## 2018-12-31 ENCOUNTER — Other Ambulatory Visit: Payer: Self-pay

## 2018-12-31 DIAGNOSIS — C9110 Chronic lymphocytic leukemia of B-cell type not having achieved remission: Secondary | ICD-10-CM

## 2019-01-01 ENCOUNTER — Other Ambulatory Visit: Payer: Self-pay

## 2019-01-02 ENCOUNTER — Inpatient Hospital Stay: Payer: Medicare Other

## 2019-01-02 ENCOUNTER — Encounter: Payer: Self-pay | Admitting: Oncology

## 2019-01-02 ENCOUNTER — Inpatient Hospital Stay: Payer: Medicare Other | Attending: Oncology | Admitting: Oncology

## 2019-01-02 ENCOUNTER — Other Ambulatory Visit: Payer: Self-pay

## 2019-01-02 VITALS — BP 110/70 | HR 60 | Temp 97.3°F

## 2019-01-02 DIAGNOSIS — K219 Gastro-esophageal reflux disease without esophagitis: Secondary | ICD-10-CM | POA: Diagnosis not present

## 2019-01-02 DIAGNOSIS — I10 Essential (primary) hypertension: Secondary | ICD-10-CM | POA: Diagnosis not present

## 2019-01-02 DIAGNOSIS — M1990 Unspecified osteoarthritis, unspecified site: Secondary | ICD-10-CM | POA: Insufficient documentation

## 2019-01-02 DIAGNOSIS — Z79899 Other long term (current) drug therapy: Secondary | ICD-10-CM | POA: Diagnosis not present

## 2019-01-02 DIAGNOSIS — G20 Parkinson's disease: Secondary | ICD-10-CM | POA: Insufficient documentation

## 2019-01-02 DIAGNOSIS — C9110 Chronic lymphocytic leukemia of B-cell type not having achieved remission: Secondary | ICD-10-CM | POA: Diagnosis present

## 2019-01-02 LAB — PATHOLOGIST SMEAR REVIEW

## 2019-01-02 LAB — CBC WITH DIFFERENTIAL/PLATELET
Abs Immature Granulocytes: 0.08 10*3/uL — ABNORMAL HIGH (ref 0.00–0.07)
Basophils Absolute: 0.1 10*3/uL (ref 0.0–0.1)
Basophils Relative: 0 %
Eosinophils Absolute: 0.1 10*3/uL (ref 0.0–0.5)
Eosinophils Relative: 0 %
HCT: 40.3 % (ref 36.0–46.0)
Hemoglobin: 12.7 g/dL (ref 12.0–15.0)
Immature Granulocytes: 0 %
Lymphocytes Relative: 91 %
Lymphs Abs: 76.7 10*3/uL — ABNORMAL HIGH (ref 0.7–4.0)
MCH: 31.3 pg (ref 26.0–34.0)
MCHC: 31.5 g/dL (ref 30.0–36.0)
MCV: 99.3 fL (ref 80.0–100.0)
Monocytes Absolute: 5.1 10*3/uL — ABNORMAL HIGH (ref 0.1–1.0)
Monocytes Relative: 6 %
Neutro Abs: 2.6 10*3/uL (ref 1.7–7.7)
Neutrophils Relative %: 3 %
Platelets: 194 10*3/uL (ref 150–400)
RBC Morphology: NORMAL
RBC: 4.06 MIL/uL (ref 3.87–5.11)
RDW: 14.6 % (ref 11.5–15.5)
Smear Review: NORMAL
WBC: 84.8 10*3/uL (ref 4.0–10.5)
nRBC: 0 % (ref 0.0–0.2)

## 2019-01-02 NOTE — Progress Notes (Signed)
[place]  [phone]) [phone] [phone]  ID: Rachael Pham OB: [birth year]  MR#: [id number]  MVH#:[id number]  Patient Care Team: Renata Caprice, DO as PCP - General (Family Medicine)  CHIEF COMPLAINT: CLL  INTERVAL HISTORY: Patient returns to clinic today for repeat laboratory work and routine 62-month evaluation.  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  She continues to have chronic weakness and fatigue and requires a wheelchair.  She has no neurologic complaints.  She denies any recent fevers or night sweats.  She has a good appetite and denies weight loss. She denies any pain. She denies any chest pain, shortness of breath, or cough.  She has no nausea, vomiting, constipation, or diarrhea.  She has no urinary complaints.  Patient offers no further specific complaints today.  REVIEW OF SYSTEMS:   Review of Systems  Constitutional: Positive for malaise/fatigue. Negative for fever and weight loss.  HENT: Negative.  Negative for nosebleeds and sinus pain.   Respiratory: Negative.  Negative for cough and shortness of breath.   Cardiovascular: Negative.  Negative for chest pain and leg swelling.  Gastrointestinal: Negative.  Negative for abdominal pain and constipation.  Genitourinary: Negative.  Negative for dysuria.  Musculoskeletal: Positive for joint pain. Negative for back pain.  Skin: Negative.  Negative for rash.  Neurological: Positive for weakness. Negative for sensory change and headaches.  Psychiatric/Behavioral: Negative.  The patient is not nervous/anxious.     As per HPI. Otherwise, a complete review of systems is negative.  PAST MEDICAL HISTORY: Past Medical History:  Diagnosis Date  . GERD (gastroesophageal reflux disease)   . Hypertension   . Osteoarthritis   . 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 Parkinson disease ([place])   .  Unspecified convulsions ([place])   . Uterine cancer ([place])     PAST SURGICAL HISTORY: Reviewed and unchanged. FAMILY HISTORY: Reviewed and unchanged. No reported history of malignancy or chronic disease.     ADVANCED DIRECTIVES:    HEALTH MAINTENANCE: Social History   Tobacco Use  . Smoking status: Never Smoker  . Smokeless tobacco: Never Used  Substance Use Topics  . Alcohol use: No  . Drug use: No     Allergies  Allergen Reactions  . Penicillins   . Sulfa Antibiotics     Current Outpatient Medications  Medication Sig Dispense Refill  . acetaminophen (TYLENOL) 325 MG tablet Take 650 mg by mouth every 4 (four) hours as needed.    Marland Kitchen alendronate (FOSAMAX) 70 MG tablet Take 70 mg by mouth once a week. Take with a full glass of water on an empty stomach.    . bacitracin-polymyxin b (POLYSPORIN) ointment Apply topically 2 (two) times daily.    . Calcium Carb-Cholecalciferol (CALCIUM-VITAMIN D) 600-400 MG-UNIT TABS Take by mouth.    . Cholecalciferol (VITAMIN D3) 2000 units capsule Take 4,000 Units by mouth daily.    . furosemide (LASIX) 20 MG tablet 1 tablet.    . gabapentin (NEURONTIN) 300 MG capsule Take 300 mg by mouth 2 (two) times daily. Reported on 10/28/2015    . ketorolac (ACULAR) 0.5 % ophthalmic solution Apply 1 drop to eye 4 (four) times daily as needed.    . loratadine (CLARITIN) 10 MG tablet Take 10 mg by mouth daily.    . magnesium hydroxide (MILK OF MAGNESIA) 400 MG/5ML suspension Take 30 mLs by mouth  every other [place].    Vladimir Faster Glycol-Propyl Glycol 0.4-0.3 % SOLN Apply 1 drop to eye. Every 2 days    . potassium chloride (K-DUR,KLOR-CON) 10 MEQ tablet Take 1 tablet by mouth 1 [place] or 1 dose.    . senna (SENOKOT) 8.6 MG tablet Take 2 tablets by mouth daily.     No current facility-administered medications for this visit.     OBJECTIVE: Vitals:   01/02/19 1105  BP: 110/70  Pulse: 60  Temp: (!) 97.3 F (36.3 C)     There is no height or weight on file to  calculate BMI.    ECOG FS:2 - Symptomatic, <50% confined to bed  General: Well-developed, well-nourished, no acute distress.  Sitting in a wheelchair. Eyes: Pink conjunctiva, anicteric sclera. HEENT: Normocephalic, moist mucous membranes. Lungs: Clear to auscultation bilaterally. Heart: Regular rate and rhythm. No rubs, murmurs, or gallops. Abdomen: Soft, nontender, nondistended. No organomegaly noted, normoactive bowel sounds. Musculoskeletal: No edema, cyanosis, or clubbing. Neuro: Alert, answering all questions appropriately. Cranial nerves grossly intact. Skin: Left arm with 2 gauze dressings, Clean dry and intact.    Psych: Normal affect.  LAB RESULTS:  Lab Results  Component Value Date   NA 134 (L) 07/22/2012   K 4.3 07/22/2012   CL 97 (L) 07/22/2012   CO2 32 07/22/2012   GLUCOSE 90 07/22/2012   BUN 16 07/22/2012   CREATININE 0.64 07/22/2012   CALCIUM 8.7 07/22/2012   PROT 6.5 07/22/2012   ALBUMIN 3.4 07/22/2012   AST 25 07/22/2012   ALT 19 07/22/2012   ALKPHOS 114 07/22/2012   BILITOT 0.3 07/22/2012   GFRNONAA >60 07/22/2012   GFRAA >60 07/22/2012    Lab Results  Component Value Date   WBC 84.8 (HH) 01/02/2019   NEUTROABS 2.6 01/02/2019   HGB 12.7 01/02/2019   HCT 40.3 01/02/2019   MCV 99.3 01/02/2019   PLT 194 01/02/2019     STUDIES: No results found.  Assessment/PLAN:    1.  CLL: Flow cytometry confirmed the results.  CLL FISH panel was positive for 13q deletion.  Previously, patient's white blood cell count ranged from 34-53.6 since April 2014.  More recently she started trending up with a white blood cell count of 71.1 in January 2020 to the current level of 84.8.  She has not yet reached the doubling time of 6 months and has no other cytopenias.  No intervention is needed at this time, but patient and her son expressed understanding that she likely will require treatment with weekly Rituxan in the future.  Return to clinic in 3 months with laboratory  work in 6 months for laboratory work and further evaluation.   2.  Arm lesions: Patient reports they were squamous cell carcinoma.  Patient expressed understanding and was in agreement with plan. She also understands that She can call clinic at any time with any questions, concerns, or complaints.    Lloyd Huger, MD   01/02/2019 5:25 PM

## 2019-01-02 NOTE — Progress Notes (Signed)
Patient stated that she had been doing well. 

## 2019-03-31 ENCOUNTER — Telehealth: Payer: Self-pay | Admitting: *Deleted

## 2019-03-31 NOTE — Telephone Encounter (Signed)
Rachael Pham from [facility] called reporting that patient has a lab only appointment Friday and that they do not need to bring her I she is not seeing physician, stating they can draw her labs there eif we send an order of exactly what we want drawn. Please return her call [phone]

## 2019-03-31 NOTE — Telephone Encounter (Signed)
Yes, they can draw labs there and fax them.  CBC.

## 2019-04-01 NOTE — Telephone Encounter (Signed)
VERBAL ORDER called to [facility] and our fax number was given to her to send results

## 2019-04-03 ENCOUNTER — Other Ambulatory Visit: Payer: Medicare Other

## 2019-07-03 NOTE — Progress Notes (Deleted)
[place]  [phone] [phone]) [phone]  ID: Rachael Pham OB: [id number]  MR#: [id number]  [id number]  Patient Care Team: Renata Caprice, DO as PCP - General (Family Medicine)  CHIEF COMPLAINT: CLL  INTERVAL HISTORY: Patient returns to clinic today for repeat laboratory work and routine 69-month evaluation.  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  She continues to have chronic weakness and fatigue and requires a wheelchair.  She has no neurologic complaints.  She denies any recent fevers or night sweats.  She has a good appetite and denies weight loss. She denies any pain. She denies any chest pain, shortness of breath, or cough.  She has no nausea, vomiting, constipation, or diarrhea.  She has no urinary complaints.  Patient offers no further specific complaints today.  REVIEW OF SYSTEMS:   Review of Systems  Constitutional: Positive for malaise/fatigue. Negative for fever and weight loss.  HENT: Negative.  Negative for nosebleeds and sinus pain.   Respiratory: Negative.  Negative for cough and shortness of breath.   Cardiovascular: Negative.  Negative for chest pain and leg swelling.  Gastrointestinal: Negative.  Negative for abdominal pain and constipation.  Genitourinary: Negative.  Negative for dysuria.  Musculoskeletal: Positive for joint pain. Negative for back pain.  Skin: Negative.  Negative for rash.  Neurological: Positive for weakness. Negative for sensory change and headaches.  Psychiatric/Behavioral: Negative.  The patient is not nervous/anxious.     As per HPI. Otherwise, a complete review of systems is negative.  PAST MEDICAL HISTORY: Past Medical History:  Diagnosis Date  . GERD (gastroesophageal reflux disease)   . Hypertension   . Osteoarthritis   . 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 Parkinson disease ([place])   .  Unspecified convulsions ([place])   . Uterine cancer ([place])     PAST SURGICAL HISTORY: Reviewed and unchanged. FAMILY HISTORY: Reviewed and unchanged. No reported history of malignancy or chronic disease.     ADVANCED DIRECTIVES:    HEALTH MAINTENANCE: Social History   Tobacco Use  . Smoking status: Never Smoker  . Smokeless tobacco: Never Used  Substance Use Topics  . Alcohol use: No  . Drug use: No     Allergies  Allergen Reactions  . Penicillins   . Sulfa Antibiotics     Current Outpatient Medications  Medication Sig Dispense Refill  . acetaminophen (TYLENOL) 325 MG tablet Take 650 mg by mouth every 4 (four) hours as needed.    Marland Kitchen alendronate (FOSAMAX) 70 MG tablet Take 70 mg by mouth once a week. Take with a full glass of water on an empty stomach.    . bacitracin-polymyxin b (POLYSPORIN) ointment Apply topically 2 (two) times daily.    . Calcium Carb-Cholecalciferol (CALCIUM-VITAMIN D) 600-400 MG-UNIT TABS Take by mouth.    . Cholecalciferol (VITAMIN D3) 2000 units capsule Take 4,000 Units by mouth daily.    . furosemide (LASIX) 20 MG tablet 1 tablet.    . gabapentin (NEURONTIN) 300 MG capsule Take 300 mg by mouth 2 (two) times daily. Reported on 10/28/2015    . ketorolac (ACULAR) 0.5 % ophthalmic solution Apply 1 drop to eye 4 (four) times daily as needed.    . loratadine (CLARITIN) 10 MG tablet Take 10 mg by mouth daily.    . magnesium hydroxide (MILK OF MAGNESIA) 400 MG/5ML suspension Take 30 mLs by mouth  every other [place].    Vladimir Faster Glycol-Propyl Glycol 0.4-0.3 % SOLN Apply 1 drop to eye. Every 2 days    . potassium chloride (K-DUR,KLOR-CON) 10 MEQ tablet Take 1 tablet by mouth 1 [place] or 1 dose.    . senna (SENOKOT) 8.6 MG tablet Take 2 tablets by mouth daily.     No current facility-administered medications for this visit.    OBJECTIVE: There were no vitals filed for this visit.   There is no height or weight on file to calculate BMI.    ECOG FS:2 - Symptomatic,  <50% confined to bed  General: Well-developed, well-nourished, no acute distress.  Sitting in a wheelchair. Eyes: Pink conjunctiva, anicteric sclera. HEENT: Normocephalic, moist mucous membranes. Lungs: Clear to auscultation bilaterally. Heart: Regular rate and rhythm. No rubs, murmurs, or gallops. Abdomen: Soft, nontender, nondistended. No organomegaly noted, normoactive bowel sounds. Musculoskeletal: No edema, cyanosis, or clubbing. Neuro: Alert, answering all questions appropriately. Cranial nerves grossly intact. Skin: Left arm with 2 gauze dressings, Clean dry and intact.    Psych: Normal affect.  LAB RESULTS:  Lab Results  Component Value Date   NA 134 (L) 07/22/2012   K 4.3 07/22/2012   CL 97 (L) 07/22/2012   CO2 32 07/22/2012   GLUCOSE 90 07/22/2012   BUN 16 07/22/2012   CREATININE 0.64 07/22/2012   CALCIUM 8.7 07/22/2012   PROT 6.5 07/22/2012   ALBUMIN 3.4 07/22/2012   AST 25 07/22/2012   ALT 19 07/22/2012   ALKPHOS 114 07/22/2012   BILITOT 0.3 07/22/2012   GFRNONAA >60 07/22/2012   GFRAA >60 07/22/2012    Lab Results  Component Value Date   WBC 84.8 (HH) 01/02/2019   NEUTROABS 2.6 01/02/2019   HGB 12.7 01/02/2019   HCT 40.3 01/02/2019   MCV 99.3 01/02/2019   PLT 194 01/02/2019     STUDIES: No results found.  Assessment/PLAN:    1.  CLL: Flow cytometry confirmed the results.  CLL FISH panel was positive for 13q deletion.  Previously, patient's white blood cell count ranged from 34-53.6 since April 2014.  More recently she started trending up with a white blood cell count of 71.1 in January 2020 to the current level of 84.8.  She has not yet reached the doubling time of 6 months and has no other cytopenias.  No intervention is needed at this time, but patient and her son expressed understanding that she likely will require treatment with weekly Rituxan in the future.  Return to clinic in 3 months with laboratory work in 6 months for laboratory work and  further evaluation.   2.  Arm lesions: Patient reports they were squamous cell carcinoma.  Patient expressed understanding and was in agreement with plan. She also understands that She can call clinic at any time with any questions, concerns, or complaints.    Lloyd Huger, MD   07/03/2019 6:44 AM

## 2019-07-08 ENCOUNTER — Inpatient Hospital Stay: Payer: Medicare Other

## 2019-07-08 ENCOUNTER — Inpatient Hospital Stay: Payer: Medicare Other | Admitting: Oncology

## 2019-08-03 ENCOUNTER — Emergency Department (HOSPITAL_COMMUNITY)
Admission: EM | Admit: 2019-08-03 | Discharge: 2019-08-04 | Disposition: A | Discharge status: Home / Self Care | Payer: Medicare Other | Attending: Emergency Medicine | Admitting: Emergency Medicine

## 2019-08-03 ENCOUNTER — Other Ambulatory Visit: Payer: Self-pay

## 2019-08-03 ENCOUNTER — Encounter (HOSPITAL_COMMUNITY): Payer: Self-pay | Admitting: Emergency Medicine

## 2019-08-03 ENCOUNTER — Emergency Department (HOSPITAL_COMMUNITY): Payer: Medicare Other

## 2019-08-03 DIAGNOSIS — F0280 Dementia in other diseases classified elsewhere without behavioral disturbance: Secondary | ICD-10-CM | POA: Insufficient documentation

## 2019-08-03 DIAGNOSIS — I10 Essential (primary) hypertension: Secondary | ICD-10-CM | POA: Diagnosis not present

## 2019-08-03 DIAGNOSIS — Z79899 Other long term (current) drug therapy: Secondary | ICD-10-CM | POA: Diagnosis not present

## 2019-08-03 DIAGNOSIS — R0602 Shortness of breath: Secondary | ICD-10-CM | POA: Diagnosis present

## 2019-08-03 DIAGNOSIS — U071 COVID-19: Secondary | ICD-10-CM | POA: Diagnosis not present

## 2019-08-03 DIAGNOSIS — Z7982 Long term (current) use of aspirin: Secondary | ICD-10-CM | POA: Insufficient documentation

## 2019-08-03 DIAGNOSIS — G20 Parkinson's disease: Secondary | ICD-10-CM | POA: Insufficient documentation

## 2019-08-03 HISTORY — DX: Paraplegia, unspecified: G82.20

## 2019-08-03 LAB — CBC WITH DIFFERENTIAL/PLATELET
Abs Immature Granulocytes: 0.44 10*3/uL — ABNORMAL HIGH (ref 0.00–0.07)
Basophils Absolute: 0.1 10*3/uL (ref 0.0–0.1)
Basophils Relative: 0 %
Eosinophils Absolute: 0.1 10*3/uL (ref 0.0–0.5)
Eosinophils Relative: 0 %
HCT: 43.9 % (ref 36.0–46.0)
Hemoglobin: 13.2 g/dL (ref 12.0–15.0)
Immature Granulocytes: 0 %
Lymphocytes Relative: 89 %
Lymphs Abs: 91.1 10*3/uL — ABNORMAL HIGH (ref 0.7–4.0)
MCH: 29.7 pg (ref 26.0–34.0)
MCHC: 30.1 g/dL (ref 30.0–36.0)
MCV: 98.9 fL (ref 80.0–100.0)
Monocytes Absolute: 2.8 10*3/uL — ABNORMAL HIGH (ref 0.1–1.0)
Monocytes Relative: 3 %
Neutro Abs: 8.7 10*3/uL — ABNORMAL HIGH (ref 1.7–7.7)
Neutrophils Relative %: 8 %
Platelets: 277 10*3/uL (ref 150–400)
RBC: 4.44 MIL/uL (ref 3.87–5.11)
RDW: 15.6 % — ABNORMAL HIGH (ref 11.5–15.5)
WBC: 103.1 10*3/uL (ref 4.0–10.5)
nRBC: 0 % (ref 0.0–0.2)

## 2019-08-03 LAB — RESPIRATORY PANEL BY RT PCR (FLU A&B, COVID)
Influenza A by PCR: NEGATIVE
Influenza B by PCR: NEGATIVE
SARS Coronavirus 2 by RT PCR: POSITIVE — AB

## 2019-08-03 LAB — COMPREHENSIVE METABOLIC PANEL
ALT: 10 U/L (ref 0–44)
AST: 25 U/L (ref 15–41)
Albumin: 3 g/dL — ABNORMAL LOW (ref 3.5–5.0)
Alkaline Phosphatase: 89 U/L (ref 38–126)
Anion gap: 13 (ref 5–15)
BUN: 27 mg/dL — ABNORMAL HIGH (ref 8–23)
CO2: 29 mmol/L (ref 22–32)
Calcium: 9.1 mg/dL (ref 8.9–10.3)
Chloride: 96 mmol/L — ABNORMAL LOW (ref 98–111)
Creatinine, Ser: 0.72 mg/dL (ref 0.44–1.00)
GFR calc Af Amer: >60 mL/min (ref >60)
GFR calc non Af Amer: >60 mL/min (ref >60)
Glucose, Bld: 115 mg/dL — ABNORMAL HIGH (ref 70–99)
Potassium: 3.4 mmol/L — ABNORMAL LOW (ref 3.5–5.1)
Sodium: 138 mmol/L (ref 135–145)
Total Bilirubin: 1.2 mg/dL (ref 0.3–1.2)
Total Protein: 6.2 g/dL — ABNORMAL LOW (ref 6.5–8.1)

## 2019-08-03 LAB — TROPONIN I (HIGH SENSITIVITY)
Troponin I (High Sensitivity): 13 ng/L (ref <18)
Troponin I (High Sensitivity): 13 ng/L (ref <18)

## 2019-08-03 LAB — BRAIN NATRIURETIC PEPTIDE: B Natriuretic Peptide: 110 pg/mL — ABNORMAL HIGH (ref 0.0–100.0)

## 2019-08-03 LAB — D-DIMER, QUANTITATIVE: D-Dimer, Quant: 0.95 ug/mL-FEU — ABNORMAL HIGH (ref 0.00–0.50)

## 2019-08-03 MED ORDER — IOHEXOL 350 MG/ML SOLN
100.0000 mL | Freq: Once | INTRAVENOUS | Status: AC | PRN
  Administered 2019-08-03: 100 mL via INTRAVENOUS

## 2019-08-03 MED ORDER — ALBUTEROL SULFATE HFA 108 (90 BASE) MCG/ACT IN AERS
2.0000 | INHALATION_SPRAY | Freq: Once | RESPIRATORY_TRACT | Status: AC
  Administered 2019-08-03: 20:00:00 2 via RESPIRATORY_TRACT
  Filled 2019-08-03: qty 6.7

## 2019-08-03 NOTE — ED Triage Notes (Signed)
Pt brought in from [facility] for evaluation of shortness of breath. Pt just finished Levaquin for UTI. Pt just states she does not feel well at this time and has a sore throat. Pt was covid positive last month.

## 2019-08-03 NOTE — ED Provider Notes (Signed)
[facility] EMERGENCY DEPARTMENT Provider Note   CSN: [id number] Arrival date & time: 08/03/19  1300     History Chief Complaint  Patient presents with  . Shortness of Breath    Rachael Pham is a 86 y.o. female.  Patient was sent over from the nursing home because of shortness of breath.  She was diagnosed with Covid a month ago  The history is provided by the nursing home. No language interpreter was used.  Shortness of Breath Severity:  Mild Onset quality:  Gradual Timing:  Constant Progression:  Unchanged Chronicity:  Recurrent Context: not animal exposure   Relieved by:  Nothing      Past Medical History:  Diagnosis Date  . GERD (gastroesophageal reflux disease)   . Hypertension   . Osteoarthritis   . 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 Paraplegia ([place])   . Parkinson disease ([place])   . Unspecified convulsions ([place])   . Uterine cancer [facility])     Patient Active Problem List   Diagnosis Date Noted  . CLL (chronic lymphocytic leukemia) ([place]) 05/03/2016    Past Surgical History:  Procedure Laterality Date  . CHOLECYSTECTOMY    . KNEE SURGERY    . ORIF HIP FRACTURE    . TONSILLECTOMY       OB History   No obstetric history on file.     History reviewed. No pertinent family history.  Social History   Tobacco Use  . Smoking status: Never Smoker  . Smokeless tobacco: Never Used  Substance Use Topics  . Alcohol use: No  . Drug use: No    Home Medications Prior to Admission medications   Medication Sig Start Date End Date Taking? Authorizing Provider  acetaminophen (TYLENOL) 325 MG tablet Take 650 mg by mouth every 6 (six) hours as needed for mild pain or moderate pain.   Yes [provider]  aspirin EC 81 MG tablet Take 81 mg by mouth daily.   Yes [provider]  Calcium Carb-Cholecalciferol (CALCIUM-VITAMIN D) 600-400 MG-UNIT TABS Take 1 tablet by mouth every morning.    Yes [provider]    Cholecalciferol (VITAMIN D3) 2000 units capsule Take 4,000 Units by mouth daily.   Yes [provider]  furosemide (LASIX) 20 MG tablet Take 20 mg by mouth daily.  06/23/18  Yes [provider]  gabapentin (NEURONTIN) 300 MG capsule Take 300 mg by mouth 3 (three) times daily.    Yes [provider]  lactobacillus acidophilus & bulgar (LACTINEX) chewable tablet Chew 1 tablet by mouth 2 (two) times daily with a meal.   Yes [provider]  levofloxacin (LEVAQUIN) 500 MG tablet Take 500 mg by mouth daily. 7 [place] course starting on 07/28/2019   Yes [provider]  magnesium hydroxide (MILK OF MAGNESIA) 400 MG/5ML suspension Take 30 mLs by mouth daily.    Yes [provider]  potassium chloride (K-DUR,KLOR-CON) 10 MEQ tablet Take 1 tablet by mouth daily with breakfast.  06/23/18  Yes [provider]  rosuvastatin (CRESTOR) 20 MG tablet Take 20 mg by mouth every evening.   Yes [provider]  senna (SENOKOT) 8.6 MG tablet Take 2 tablets by mouth every 4 (four) hours as needed for constipation.    Yes [provider]    Allergies    Penicillins and Sulfa antibiotics  Review of Systems   Review of Systems  Unable to perform ROS: Dementia  Respiratory: Positive for shortness  of breath.     Physical Exam Updated Vital Signs BP 101/76   Pulse 94   Temp 98.3 F (36.8 C) (Oral)   Resp 14   Ht 5\' 5"  (1.651 m)   Wt 69 kg   SpO2 95%   BMI 25.31 kg/m   Physical Exam Vitals and nursing note reviewed.  Constitutional:      Appearance: She is well-developed.  HENT:     Head: Normocephalic.     Nose: Nose normal.  Eyes:     General: No scleral icterus.    Conjunctiva/sclera: Conjunctivae normal.  Neck:     Thyroid: No thyromegaly.  Cardiovascular:     Rate and Rhythm: Normal rate and regular rhythm.     Heart sounds: No murmur. No friction rub. No gallop.   Pulmonary:     Breath sounds: No stridor. No wheezing  or rales.  Chest:     Chest wall: No tenderness.  Abdominal:     General: There is no distension.     Tenderness: There is no abdominal tenderness. There is no rebound.  Musculoskeletal:        General: Normal range of motion.     Cervical back: Neck supple.  Lymphadenopathy:     Cervical: No cervical adenopathy.  Skin:    Findings: No erythema or rash.  Neurological:     Mental Status: She is alert.     Motor: No abnormal muscle tone.     Coordination: Coordination normal.     Comments: Alert oriented to person only  Psychiatric:        Behavior: Behavior normal.     ED Results / Procedures / Treatments   Labs (all labs ordered are listed, but only abnormal results are displayed) Labs Reviewed  RESPIRATORY PANEL BY RT PCR (FLU A&B, COVID) - Abnormal; Notable for the following components:      Result Value   SARS Coronavirus 2 by RT PCR POSITIVE (*)    All other components within normal limits  CBC WITH DIFFERENTIAL/PLATELET - Abnormal; Notable for the following components:   WBC 103.1 (*)    RDW 15.6 (*)    Neutro Abs 8.7 (*)    Lymphs Abs 91.1 (*)    Monocytes Absolute 2.8 (*)    Abs Immature Granulocytes 0.44 (*)    All other components within normal limits  COMPREHENSIVE METABOLIC PANEL - Abnormal; Notable for the following components:   Potassium 3.4 (*)    Chloride 96 (*)    Glucose, Bld 115 (*)    BUN 27 (*)    Total Protein 6.2 (*)    Albumin 3.0 (*)    All other components within normal limits  BRAIN NATRIURETIC PEPTIDE - Abnormal; Notable for the following components:   B Natriuretic Peptide 110.0 (*)    All other components within normal limits  D-DIMER, QUANTITATIVE (NOT AT [facility]) - Abnormal; Notable for the following components:   D-Dimer, Quant 0.95 (*)    All other components within normal limits  TROPONIN I (HIGH SENSITIVITY)  TROPONIN I (HIGH SENSITIVITY)    EKG None  Radiology CT Angio Chest PE W and/or Wo Contrast  Result Date:  08/03/2019 CLINICAL DATA:  Short of breath EXAM: CT ANGIOGRAPHY CHEST WITH CONTRAST TECHNIQUE: Multidetector CT imaging of the chest was performed using the standard protocol during bolus administration of intravenous contrast. Multiplanar CT image reconstructions and MIPs were obtained to evaluate the vascular anatomy. CONTRAST:  133mL OMNIPAQUE IOHEXOL 350 MG/ML  SOLN COMPARISON:  CT 04/23/2012 FINDINGS: Cardiovascular: Satisfactory opacification of the pulmonary arteries to the segmental level. No evidence of pulmonary embolism. Mild aortic atherosclerosis. No aneurysmal dilatation. Contrast is present within the ascending aorta with subsequent dilutional effect at the arch and descending aorta. Heterogeneous appearance of the thoracic arch and descending thoracic aorta suspected to be secondary to mixing artifact. Patchy areas of low density at the ascending aorta, suspect secondary to streak artifact from adjacent dense contrast in the SVC. Heart size within normal limits. Slight reflux of contrast into the IVC consistent with elevated right heart pressure. No pericardial effusion. Mediastinum/Nodes: Midline trachea. No thyroid mass. Subcentimeter mediastinal lymph nodes. Esophagus within normal limits. Lungs/Pleura: Negative for pneumothorax or pleural effusion. Patchy consolidations and ground-glass densities within the bilateral lungs, most evident in the left upper and bilateral lower lobes. Mild bronchiectasis in the lower lobes. Upper Abdomen: Status post cholecystectomy. No acute abnormality within the abdomen. Musculoskeletal: Degenerative changes. No acute or suspicious osseous abnormality. Review of the MIP images confirms the above findings. IMPRESSION: 1. Negative for acute pulmonary embolus. 2. Patchy bilateral left greater than right foci of consolidation and ground-glass density, felt consistent with bilateral pneumonia, possible atypical or viral pneumonia. 3. Poorly opacified aortic arch and  descending aorta with heterogeneous attenuation, suspected to be secondary to mixing artifact and suggestive of a degree of cardiac dysfunction. Aortic Atherosclerosis (ICD10-I70.0). Electronically Signed   By: Donavan Foil M.D.   On: 08/03/2019 18:56   DG Chest Portable 1 View  Result Date: 08/03/2019 CLINICAL DATA:  Shortness of breath. EXAM: PORTABLE CHEST 1 VIEW COMPARISON:  Single view of the chest 09/25/2011. FINDINGS: There is hazy bibasilar airspace disease. No pneumothorax or pleural effusion. Heart size is normal. Advanced bilateral glenohumeral degenerative disease is noted. IMPRESSION: Hazy bibasilar airspace disease could be due to atelectasis or pneumonia. Electronically Signed   By: Inge Rise M.D.   On: 08/03/2019 16:16    Procedures Procedures (including critical care time)  Medications Ordered in ED Medications  albuterol (VENTOLIN HFA) 108 (90 Base) MCG/ACT inhaler 2 puff (has no administration in time range)  iohexol (OMNIPAQUE) 350 MG/ML injection 100 mL (100 mLs Intravenous Contrast Given 08/03/19 1803)    ED Course  I have reviewed the triage vital signs and the nursing notes.  Pertinent labs & imaging results that were available during my care of the patient were reviewed by me and considered in my medical decision making (see chart for details).    MDM Rules/Calculators/A&P                     Patient with Covid infection.  Mild shortness of breath.  But O2 sats normal in the emergency department.  She will use albuterol inhaler and return to the nursing home  Final Clinical Impression(s) / ED Diagnoses Final diagnoses:  COVID-19    Rx / DC Orders ED Discharge Orders    None       Milton Ferguson, MD 08/03/19 [phone]

## 2019-08-03 NOTE — ED Notes (Signed)
Pt's son, Jodell Cipro Johnsey, requests to be called with update at [phone]

## 2019-08-03 NOTE — ED Notes (Signed)
Date and time results received: 08/03/19 1658 (use smartphrase ".now" to insert current time)  Test: WBC  Critical Value: 103.1  Name of Provider Notified: Dr. Roderic Palau  Orders Received? Or Actions Taken?:NA, na

## 2019-08-03 NOTE — Discharge Instructions (Addendum)
Get checked by your doctor at the end of the week.  And usual inhaler every 4-6 hours as needed for shortness of breath

## 2020-01-29 ENCOUNTER — Emergency Department (HOSPITAL_COMMUNITY): Payer: Medicare Other

## 2020-01-29 ENCOUNTER — Encounter (HOSPITAL_COMMUNITY): Payer: Self-pay

## 2020-01-29 ENCOUNTER — Emergency Department (HOSPITAL_COMMUNITY)
Admission: EM | Admit: 2020-01-29 | Discharge: 2020-01-29 | Disposition: A | Discharge status: Home / Self Care | Payer: Medicare Other | Attending: Emergency Medicine | Admitting: Emergency Medicine

## 2020-01-29 DIAGNOSIS — Z7982 Long term (current) use of aspirin: Secondary | ICD-10-CM | POA: Insufficient documentation

## 2020-01-29 DIAGNOSIS — I10 Essential (primary) hypertension: Secondary | ICD-10-CM | POA: Diagnosis not present

## 2020-01-29 DIAGNOSIS — G20 Parkinson's disease: Secondary | ICD-10-CM | POA: Insufficient documentation

## 2020-01-29 DIAGNOSIS — Z8542 Personal history of malignant neoplasm of other parts of uterus: Secondary | ICD-10-CM | POA: Diagnosis not present

## 2020-01-29 DIAGNOSIS — R0602 Shortness of breath: Secondary | ICD-10-CM | POA: Diagnosis not present

## 2020-01-29 DIAGNOSIS — Z79899 Other long term (current) drug therapy: Secondary | ICD-10-CM | POA: Diagnosis not present

## 2020-01-29 NOTE — ED Notes (Signed)
Called EMS for transport back to [facility] in [place]

## 2020-01-29 NOTE — ED Provider Notes (Signed)
[facility] EMERGENCY DEPARTMENT Provider Note   CSN: [id number] Arrival date & time: 01/29/20  1342     History Chief Complaint  Patient presents with  . Chest Pain    Rachael Pham is a 86 y.o. female.  Patient was complaining of shortness of breath earlier.  Patient is a DNR and comfort care only.  Patient stays at a nursing home.  And has no complaints now  The history is provided by the patient and medical records. No language interpreter was used.  Shortness of Breath Severity:  Mild Onset quality:  Sudden Timing:  Sporadic (Resolved) Progression:  Resolved Chronicity:  New Context: not activity   Worsened by:  Nothing Ineffective treatments:  None tried Associated symptoms: no abdominal pain, no chest pain, no cough, no headaches and no rash   Risk factors: no recent alcohol use        Past Medical History:  Diagnosis Date  . GERD (gastroesophageal reflux disease)   . Hypertension   . Osteoarthritis   . 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 Paraplegia ([place])   . Parkinson disease ([place])   . Unspecified convulsions ([place])   . Uterine cancer [facility])     Patient Active Problem List   Diagnosis Date Noted  . CLL (chronic lymphocytic leukemia) ([place]) 05/03/2016    Past Surgical History:  Procedure Laterality Date  . CHOLECYSTECTOMY    . KNEE SURGERY    . ORIF HIP FRACTURE    . TONSILLECTOMY       OB History   No obstetric history on file.     No family history on file.  Social History   Tobacco Use  . Smoking status: Never Smoker  . Smokeless tobacco: Never Used  Substance Use Topics  . Alcohol use: No  . Drug use: No    Home Medications Prior to Admission medications   Medication Sig Start Date End Date Taking? Authorizing Provider  acetaminophen (TYLENOL) 325 MG tablet Take 650 mg by mouth every 6 (six) hours as needed for mild pain or moderate pain.    [provider]  aspirin EC 81 MG tablet Take 81 mg by mouth daily.     [provider]  Calcium Carb-Cholecalciferol (CALCIUM-VITAMIN D) 600-400 MG-UNIT TABS Take 1 tablet by mouth every morning.     [provider]  Cholecalciferol (VITAMIN D3) 2000 units capsule Take 4,000 Units by mouth daily.    [provider]  furosemide (LASIX) 20 MG tablet Take 20 mg by mouth daily.  06/23/18   [provider]  gabapentin (NEURONTIN) 300 MG capsule Take 300 mg by mouth 3 (three) times daily.     [provider]  lactobacillus acidophilus & bulgar (LACTINEX) chewable tablet Chew 1 tablet by mouth 2 (two) times daily with a meal.    [provider]  levofloxacin (LEVAQUIN) 500 MG tablet Take 500 mg by mouth daily. 7 Rodriguez course starting on 07/28/2019    [provider]  magnesium hydroxide (MILK OF MAGNESIA) 400 MG/5ML suspension Take 30 mLs by mouth daily.     [provider]  potassium chloride (K-DUR,KLOR-CON) 10 MEQ tablet Take 1 tablet by mouth daily with breakfast.  06/23/18   [provider]  rosuvastatin (CRESTOR) 20 MG tablet Take 20 mg by mouth every evening.    [provider]  senna (SENOKOT) 8.6 MG tablet Take 2 tablets by mouth every 4 (four) hours as needed for constipation.  [provider]    Allergies    Penicillins and Sulfa antibiotics  Review of Systems   Review of Systems  Constitutional: Negative for appetite change and fatigue.  HENT: Negative for congestion, ear discharge and sinus pressure.   Eyes: Negative for discharge.  Respiratory: Positive for shortness of breath. Negative for cough.   Cardiovascular: Negative for chest pain.  Gastrointestinal: Negative for abdominal pain and diarrhea.  Genitourinary: Negative for frequency and hematuria.  Musculoskeletal: Negative for back pain.  Skin: Negative for rash.  Neurological: Negative for seizures and headaches.  Psychiatric/Behavioral: Negative for hallucinations.    Physical Exam Updated Vital  Signs BP (!) 107/59 (BP Location: Right Arm)   Pulse 74   Temp 98.7 F (37.1 C) (Oral)   Resp (!) 22   SpO2 98%   Physical Exam Vitals and nursing note reviewed.  Constitutional:      Appearance: She is well-developed.  HENT:     Head: Normocephalic.     Nose: Nose normal.  Eyes:     General: No scleral icterus.    Conjunctiva/sclera: Conjunctivae normal.  Neck:     Thyroid: No thyromegaly.  Cardiovascular:     Rate and Rhythm: Normal rate and regular rhythm.     Heart sounds: No murmur heard.  No friction rub. No gallop.   Pulmonary:     Breath sounds: No stridor. No wheezing or rales.  Chest:     Chest wall: No tenderness.  Abdominal:     General: There is no distension.     Tenderness: There is no abdominal tenderness. There is no rebound.  Musculoskeletal:        General: Normal range of motion.     Cervical back: Neck supple.  Lymphadenopathy:     Cervical: No cervical adenopathy.  Skin:    Findings: No erythema or rash.  Neurological:     Mental Status: She is alert and oriented to person, place, and time.     Motor: No abnormal muscle tone.     Coordination: Coordination normal.  Psychiatric:        Behavior: Behavior normal.     ED Results / Procedures / Treatments   Labs (all labs ordered are listed, but only abnormal results are displayed) Labs Reviewed - No data to display  EKG None  Radiology DG Chest [facility] 1 View  Result Date: 01/29/2020 CLINICAL DATA:  Shortness of breath EXAM: PORTABLE CHEST 1 VIEW COMPARISON:  Chest radiograph and chest CT August 03, 2019 FINDINGS: Lungs are clear. Heart size and pulmonary vascularity are normal. No adenopathy. Aorta is mildly prominent but stable. 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 IMPRESSION: Lungs clear. Heart size normal. Stable thoracic aortic prominence, a finding likely indicative of chronic hypertension. 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 Electronically Signed   By:  Lowella Grip III M.D.   On: 01/29/2020 14:54    Procedures Procedures (including critical care time)  Medications Ordered in ED Medications - No data to display  ED Course  I have reviewed the triage vital signs and the nursing notes.  Pertinent labs & imaging results that were available during my care of the patient were reviewed by me and considered in my medical decision making (see chart for details).    MDM Rules/Calculators/A&P                         Chest x-ray unremarkable Dyspnea resolved.  She will  follow-up as needed        This patient presents to the ED for concern of shortness of breath, this involves an extensive number of treatment options, and is a complaint that carries with it a high risk of complications and morbidity.  The differential diagnosis includes pneumonia PE heart failure   Lab Tests:     Medicines ordered:   I ordered medication oxygen initially for shortness of breath  Imaging Studies ordered:   I ordered imaging studies which included chest x-ray  I independently visualized and interpreted imaging which showed no acute disease  Additional history obtained:   Additional history obtained from nursing home record  Previous records obtained and reviewed.  Consultations Obtained:     Reevaluation:  After the interventions stated above, I reevaluated the patient and found improved  Critical Interventions:  .   Final Clinical Impression(s) / ED Diagnoses Final diagnoses:  SOB (shortness of breath)    Rx / DC Orders ED Discharge Orders    None       Milton Ferguson, MD 01/31/20 1254

## 2020-01-29 NOTE — ED Triage Notes (Addendum)
Pt brought from [facility] in [place] due to complaints of CP/SOB/ O2 sats 91% on RA upon EMS arrival. BP 104/54, resp 40. Pt reports that chest was very painful this morning but is not hurting now. Sats now on room air 98%

## 2020-01-29 NOTE — ED Notes (Signed)
Pt will be discharged and waiting on EMS to return to facility

## 2020-01-29 NOTE — ED Notes (Signed)
Tiffany LPN stated she called the son prior to EMS getting pt and she would call him about returning to facility

## 2020-01-29 NOTE — Discharge Instructions (Addendum)
Followup with your doctor as needed

## 2020-03-05 IMAGING — CT CT ANGIO CHEST
2 of 7 series · 15 of 36 positions shown · IV contrast (Omnipaque or Isovue)
Comparison: CT 04/23/2012

CLINICAL DATA: Short of breath

EXAM:
CT ANGIOGRAPHY CHEST WITH CONTRAST
TECHNIQUE: Multidetector CT imaging of the chest was performed using the
standard protocol during bolus administration of intravenous
contrast. Multiplanar CT image reconstructions and MIPs were
obtained to evaluate the vascular anatomy.
CONTRAST:  100mL OMNIPAQUE IOHEXOL 350 MG/ML SOLN

[Series 7: pe axial thins · axial · 0.62mm/px · z∈[-402,-141]mm · 14 of 303 slices shown]
[im 21/303  lung]
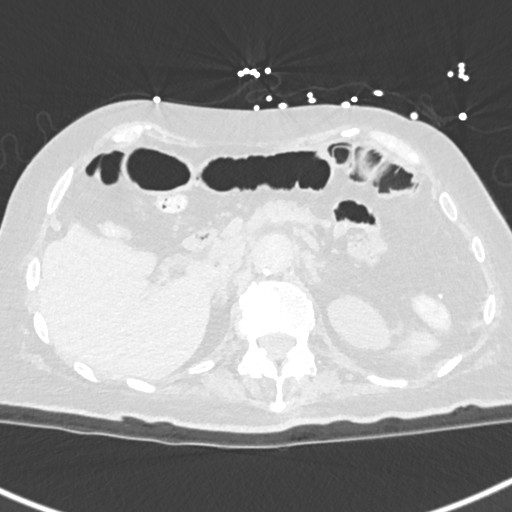
[im 41/303  mediastinal]
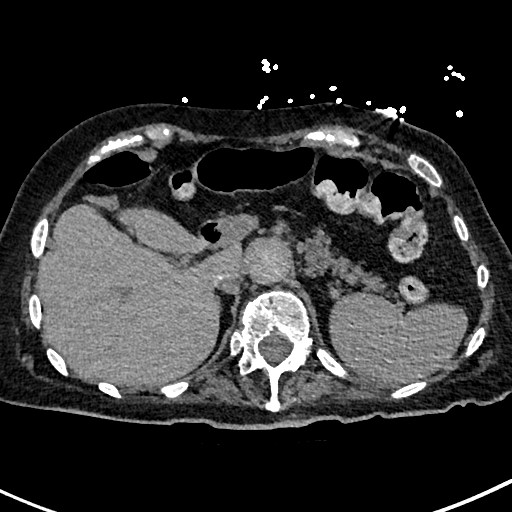
[im 61/303  lung]
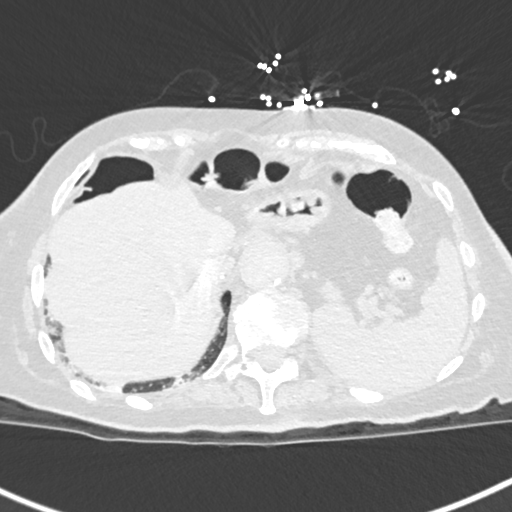
[im 81/303  mediastinal]
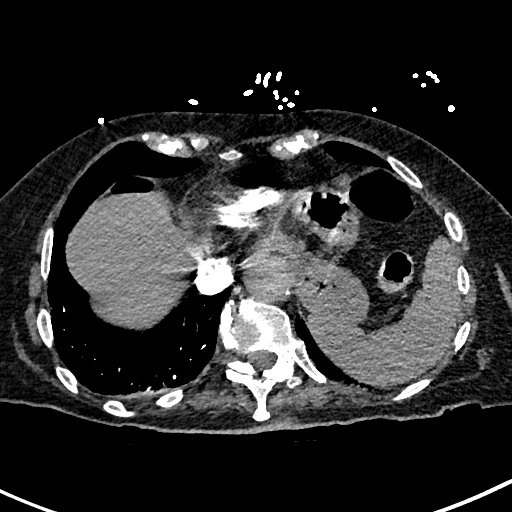
[im 101/303  lung]
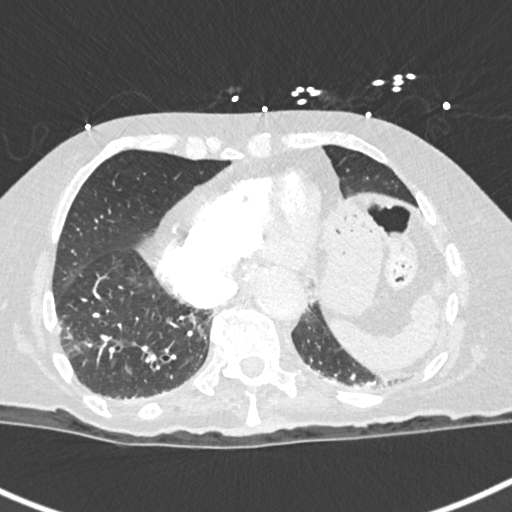
[im 121/303  mediastinal]
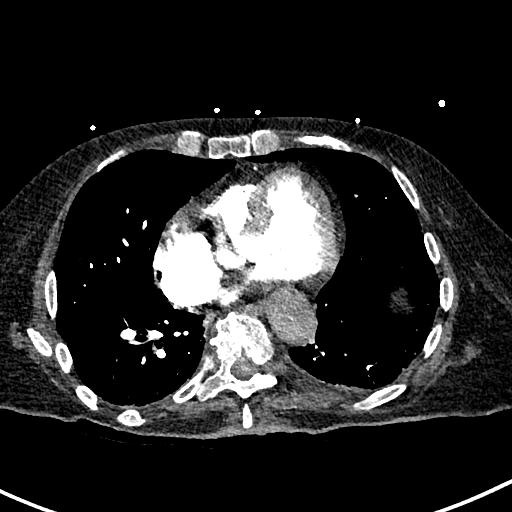
[im 141/303  lung]
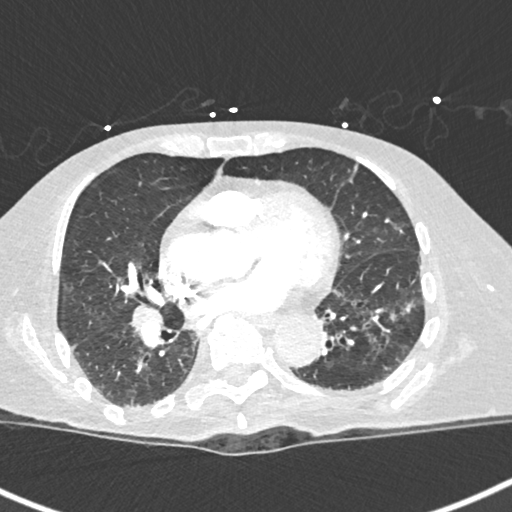
[im 162/303  mediastinal]
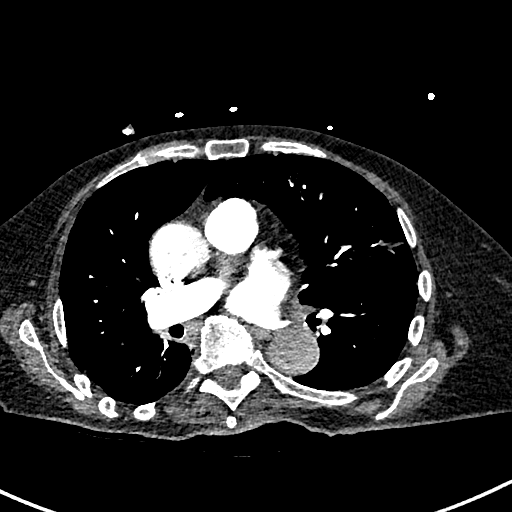
[im 182/303  lung]
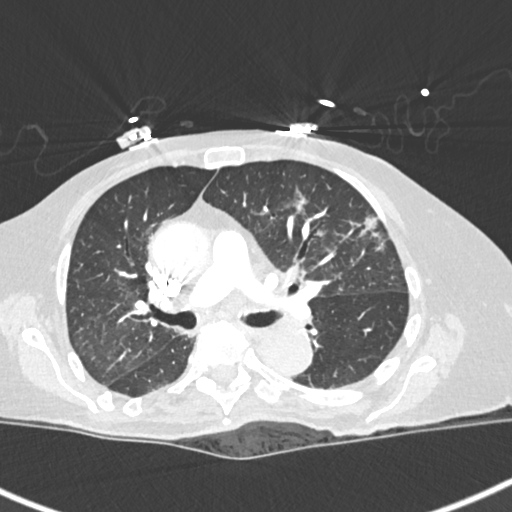
[im 202/303  mediastinal]
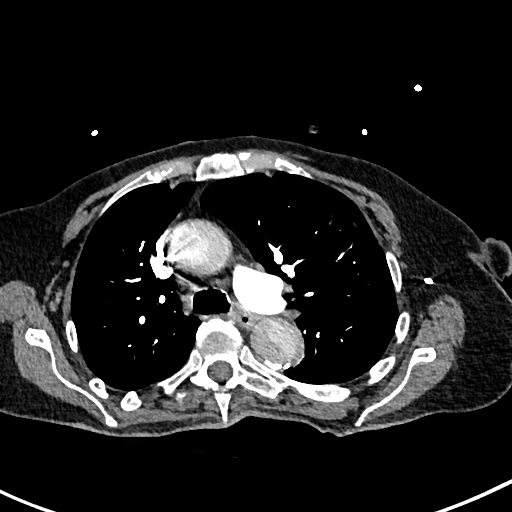
[im 222/303  lung]
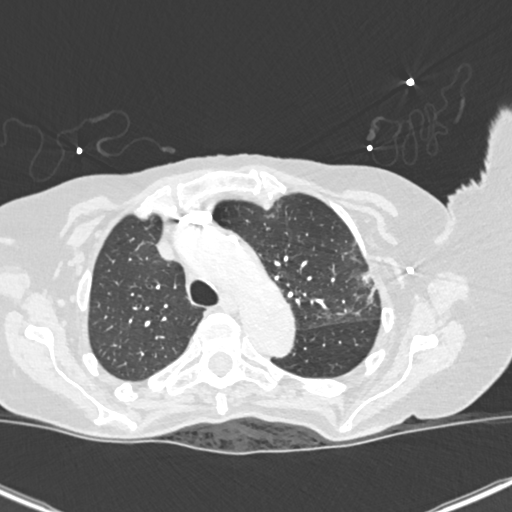
[im 242/303  mediastinal]
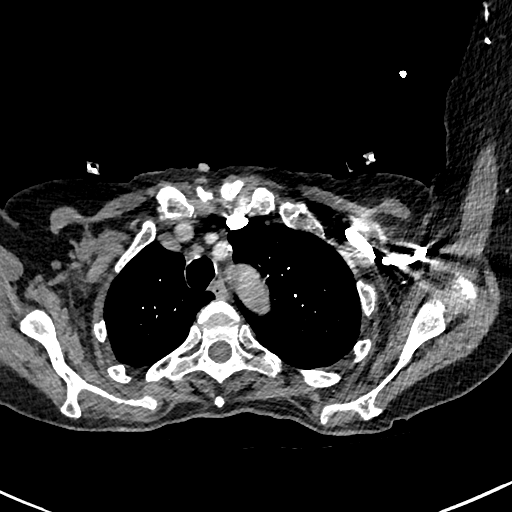
[im 262/303  lung]
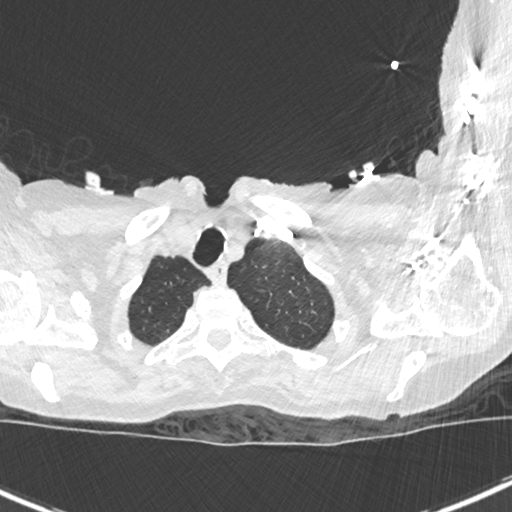
[im 282/303  mediastinal]
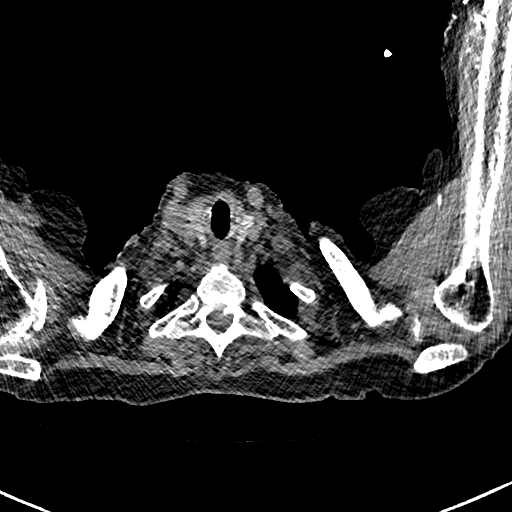

[Series 9: cor soft · coronal · 0.58mm/px · 1 of 100 slices shown]
[im 50/100  mediastinal]
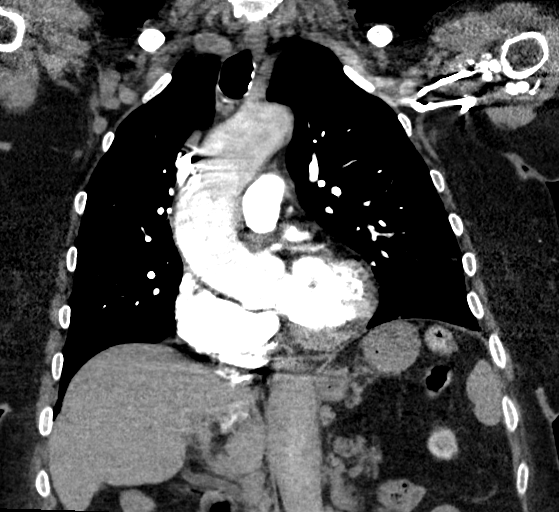

[15 of 36 positions shown; findings below may reference images not displayed]

FINDINGS: Cardiovascular: Satisfactory opacification of the pulmonary arteries
to the segmental level. No evidence of pulmonary embolism. Mild
aortic atherosclerosis. No aneurysmal dilatation. Contrast is
present within the ascending aorta with subsequent dilutional effect
at the arch and descending aorta. Heterogeneous appearance of the
thoracic arch and descending thoracic aorta suspected to be
secondary to mixing artifact. Patchy areas of low density at the
ascending aorta, suspect secondary to streak artifact from adjacent
dense contrast in the SVC. Heart size within normal limits. Slight
reflux of contrast into the IVC consistent with elevated right heart
pressure. No pericardial effusion.

Mediastinum/Nodes: Midline trachea. No thyroid mass. Subcentimeter
mediastinal lymph nodes. Esophagus within normal limits.

Lungs/Pleura: Negative for pneumothorax or pleural effusion. Patchy
consolidations and ground-glass densities within the bilateral
lungs, most evident in the left upper and bilateral lower lobes.
Mild bronchiectasis in the lower lobes.

Upper Abdomen: Status post cholecystectomy. No acute abnormality
within the abdomen.

Musculoskeletal: Degenerative changes. No acute or suspicious
osseous abnormality.

Review of the MIP images confirms the above findings.
IMPRESSION: 1. Negative for acute pulmonary embolus.
2. Patchy bilateral left greater than right foci of consolidation
and ground-glass density, felt consistent with bilateral pneumonia,
possible atypical or viral pneumonia.
3. Poorly opacified aortic arch and descending aorta with
heterogeneous attenuation, suspected to be secondary to mixing
artifact and suggestive of a degree of cardiac dysfunction.

Aortic Atherosclerosis ([id number]).

## 2020-03-18 DEATH — deceased
